# Patient Record
Sex: Female | Born: 1970 | Race: White | Hispanic: No | Marital: Single
Health system: Southern US, Community
[De-identification: ages and names within clinical notes are randomized; demographics above are authoritative.]

## PROBLEM LIST (undated history)
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
## 1998-03-01 ENCOUNTER — Ambulatory Visit (HOSPITAL_COMMUNITY): Admission: RE | Admit: 1998-03-01 | Discharge: 1998-03-01 | Payer: Self-pay

## 1998-06-19 ENCOUNTER — Other Ambulatory Visit: Admission: RE | Admit: 1998-06-19 | Discharge: 1998-06-19 | Payer: Self-pay | Admitting: Gynecology

## 1998-11-14 ENCOUNTER — Encounter: Payer: Self-pay | Admitting: Family Medicine

## 1998-11-14 ENCOUNTER — Ambulatory Visit (HOSPITAL_COMMUNITY): Admission: RE | Admit: 1998-11-14 | Discharge: 1998-11-14 | Payer: Self-pay | Admitting: Family Medicine

## 1999-12-18 ENCOUNTER — Other Ambulatory Visit: Admission: RE | Admit: 1999-12-18 | Discharge: 1999-12-18 | Payer: Self-pay | Admitting: Obstetrics and Gynecology

## 2000-06-11 ENCOUNTER — Inpatient Hospital Stay (HOSPITAL_COMMUNITY): Admission: AD | Admit: 2000-06-11 | Discharge: 2000-06-14 | Payer: Self-pay | Admitting: Obstetrics and Gynecology

## 2000-06-15 ENCOUNTER — Encounter: Admission: RE | Admit: 2000-06-15 | Discharge: 2000-07-16 | Payer: Self-pay | Admitting: Obstetrics & Gynecology

## 2012-01-22 ENCOUNTER — Telehealth: Payer: Self-pay

## 2012-01-22 NOTE — Telephone Encounter (Signed)
I tried to contact pt re: CT results. I cannot reach pt at either contact # in the chart. I will wait to hear back from her. JO CMA

## 2012-11-06 ENCOUNTER — Encounter: Payer: Self-pay | Admitting: Obstetrics and Gynecology

## 2020-07-15 ENCOUNTER — Emergency Department (HOSPITAL_COMMUNITY): Payer: Self-pay

## 2020-07-15 ENCOUNTER — Emergency Department (HOSPITAL_COMMUNITY): Payer: Self-pay | Admitting: Anesthesiology

## 2020-07-15 ENCOUNTER — Encounter (HOSPITAL_COMMUNITY): Payer: Self-pay | Admitting: General Surgery

## 2020-07-15 ENCOUNTER — Inpatient Hospital Stay (HOSPITAL_COMMUNITY): Payer: Self-pay

## 2020-07-15 ENCOUNTER — Encounter (HOSPITAL_COMMUNITY): Admission: EM | Disposition: E | Payer: Self-pay | Source: Home / Self Care

## 2020-07-15 ENCOUNTER — Inpatient Hospital Stay (HOSPITAL_COMMUNITY)
Admission: EM | Admit: 2020-07-15 | Discharge: 2020-08-07 | DRG: 957 | Disposition: E | Payer: Self-pay | Attending: Surgery | Admitting: Surgery

## 2020-07-15 DIAGNOSIS — S71111A Laceration without foreign body, right thigh, initial encounter: Secondary | ICD-10-CM | POA: Diagnosis present

## 2020-07-15 DIAGNOSIS — F1010 Alcohol abuse, uncomplicated: Secondary | ICD-10-CM | POA: Diagnosis present

## 2020-07-15 DIAGNOSIS — Z419 Encounter for procedure for purposes other than remedying health state, unspecified: Secondary | ICD-10-CM

## 2020-07-15 DIAGNOSIS — K7030 Alcoholic cirrhosis of liver without ascites: Secondary | ICD-10-CM

## 2020-07-15 DIAGNOSIS — R319 Hematuria, unspecified: Secondary | ICD-10-CM | POA: Diagnosis present

## 2020-07-15 DIAGNOSIS — R402142 Coma scale, eyes open, spontaneous, at arrival to emergency department: Secondary | ICD-10-CM | POA: Diagnosis present

## 2020-07-15 DIAGNOSIS — R0902 Hypoxemia: Secondary | ICD-10-CM

## 2020-07-15 DIAGNOSIS — R402222 Coma scale, best verbal response, incomprehensible words, at arrival to emergency department: Secondary | ICD-10-CM | POA: Diagnosis present

## 2020-07-15 DIAGNOSIS — S36521A Contusion of transverse colon, initial encounter: Secondary | ICD-10-CM | POA: Diagnosis present

## 2020-07-15 DIAGNOSIS — L8990 Pressure ulcer of unspecified site, unspecified stage: Secondary | ICD-10-CM | POA: Diagnosis not present

## 2020-07-15 DIAGNOSIS — S066X0A Traumatic subarachnoid hemorrhage without loss of consciousness, initial encounter: Principal | ICD-10-CM | POA: Diagnosis present

## 2020-07-15 DIAGNOSIS — D696 Thrombocytopenia, unspecified: Secondary | ICD-10-CM | POA: Diagnosis present

## 2020-07-15 DIAGNOSIS — J9601 Acute respiratory failure with hypoxia: Secondary | ICD-10-CM | POA: Diagnosis not present

## 2020-07-15 DIAGNOSIS — Z66 Do not resuscitate: Secondary | ICD-10-CM | POA: Diagnosis not present

## 2020-07-15 DIAGNOSIS — Z7189 Other specified counseling: Secondary | ICD-10-CM

## 2020-07-15 DIAGNOSIS — N179 Acute kidney failure, unspecified: Secondary | ICD-10-CM | POA: Diagnosis not present

## 2020-07-15 DIAGNOSIS — Z6820 Body mass index (BMI) 20.0-20.9, adult: Secondary | ICD-10-CM

## 2020-07-15 DIAGNOSIS — Z23 Encounter for immunization: Secondary | ICD-10-CM

## 2020-07-15 DIAGNOSIS — J9690 Respiratory failure, unspecified, unspecified whether with hypoxia or hypercapnia: Secondary | ICD-10-CM

## 2020-07-15 DIAGNOSIS — E872 Acidosis: Secondary | ICD-10-CM | POA: Diagnosis not present

## 2020-07-15 DIAGNOSIS — S3991XA Unspecified injury of abdomen, initial encounter: Secondary | ICD-10-CM

## 2020-07-15 DIAGNOSIS — S36429A Contusion of unspecified part of small intestine, initial encounter: Secondary | ICD-10-CM | POA: Diagnosis present

## 2020-07-15 DIAGNOSIS — R402362 Coma scale, best motor response, obeys commands, at arrival to emergency department: Secondary | ICD-10-CM | POA: Diagnosis present

## 2020-07-15 DIAGNOSIS — Z515 Encounter for palliative care: Secondary | ICD-10-CM

## 2020-07-15 DIAGNOSIS — S36892A Contusion of other intra-abdominal organs, initial encounter: Secondary | ICD-10-CM | POA: Diagnosis present

## 2020-07-15 DIAGNOSIS — Z9081 Acquired absence of spleen: Secondary | ICD-10-CM

## 2020-07-15 DIAGNOSIS — Z9911 Dependence on respirator [ventilator] status: Secondary | ICD-10-CM

## 2020-07-15 DIAGNOSIS — E46 Unspecified protein-calorie malnutrition: Secondary | ICD-10-CM | POA: Diagnosis present

## 2020-07-15 DIAGNOSIS — S36114A Minor laceration of liver, initial encounter: Secondary | ICD-10-CM | POA: Diagnosis present

## 2020-07-15 DIAGNOSIS — T148XXA Other injury of unspecified body region, initial encounter: Secondary | ICD-10-CM

## 2020-07-15 DIAGNOSIS — E870 Hyperosmolality and hypernatremia: Secondary | ICD-10-CM | POA: Diagnosis not present

## 2020-07-15 DIAGNOSIS — D62 Acute posthemorrhagic anemia: Secondary | ICD-10-CM | POA: Diagnosis not present

## 2020-07-15 DIAGNOSIS — E8809 Other disorders of plasma-protein metabolism, not elsewhere classified: Secondary | ICD-10-CM | POA: Diagnosis not present

## 2020-07-15 DIAGNOSIS — T1490XA Injury, unspecified, initial encounter: Secondary | ICD-10-CM

## 2020-07-15 DIAGNOSIS — Z4659 Encounter for fitting and adjustment of other gastrointestinal appliance and device: Secondary | ICD-10-CM

## 2020-07-15 DIAGNOSIS — Z20822 Contact with and (suspected) exposure to covid-19: Secondary | ICD-10-CM | POA: Diagnosis present

## 2020-07-15 DIAGNOSIS — J9602 Acute respiratory failure with hypercapnia: Secondary | ICD-10-CM | POA: Diagnosis not present

## 2020-07-15 DIAGNOSIS — J189 Pneumonia, unspecified organism: Secondary | ICD-10-CM | POA: Diagnosis not present

## 2020-07-15 DIAGNOSIS — S0081XA Abrasion of other part of head, initial encounter: Secondary | ICD-10-CM | POA: Diagnosis present

## 2020-07-15 DIAGNOSIS — S36039A Unspecified laceration of spleen, initial encounter: Secondary | ICD-10-CM | POA: Diagnosis present

## 2020-07-15 DIAGNOSIS — S36113A Laceration of liver, unspecified degree, initial encounter: Secondary | ICD-10-CM | POA: Diagnosis present

## 2020-07-15 DIAGNOSIS — R339 Retention of urine, unspecified: Secondary | ICD-10-CM | POA: Diagnosis not present

## 2020-07-15 DIAGNOSIS — S82841B Displaced bimalleolar fracture of right lower leg, initial encounter for open fracture type I or II: Secondary | ICD-10-CM | POA: Diagnosis present

## 2020-07-15 DIAGNOSIS — Z978 Presence of other specified devices: Secondary | ICD-10-CM

## 2020-07-15 DIAGNOSIS — K7031 Alcoholic cirrhosis of liver with ascites: Secondary | ICD-10-CM | POA: Diagnosis present

## 2020-07-15 DIAGNOSIS — I959 Hypotension, unspecified: Secondary | ICD-10-CM | POA: Diagnosis present

## 2020-07-15 DIAGNOSIS — D689 Coagulation defect, unspecified: Secondary | ICD-10-CM | POA: Diagnosis present

## 2020-07-15 HISTORY — PX: LAPAROTOMY: SHX154

## 2020-07-15 LAB — URINALYSIS, ROUTINE W REFLEX MICROSCOPIC
Bilirubin Urine: NEGATIVE
Glucose, UA: >=500 mg/dL — AB
Ketones, ur: NEGATIVE mg/dL
Nitrite: NEGATIVE
Protein, ur: NEGATIVE mg/dL
Specific Gravity, Urine: 1.006 (ref 1.005–1.030)
pH: 6 (ref 5.0–8.0)

## 2020-07-15 LAB — I-STAT ARTERIAL BLOOD GAS, ED
Acid-base deficit: 11 mmol/L — ABNORMAL HIGH (ref 0.0–2.0)
Acid-base deficit: 13 mmol/L — ABNORMAL HIGH (ref 0.0–2.0)
Bicarbonate: 16 mmol/L — ABNORMAL LOW (ref 20.0–28.0)
Bicarbonate: 12.9 mmol/L — ABNORMAL LOW (ref 20.0–28.0)
Calcium, Ion: 0.86 mmol/L — CL (ref 1.15–1.40)
Calcium, Ion: 1.15 mmol/L (ref 1.15–1.40)
HCT: 33 % — ABNORMAL LOW (ref 36.0–46.0)
HCT: 32 % — ABNORMAL LOW (ref 36.0–46.0)
Hemoglobin: 11.2 g/dL — ABNORMAL LOW (ref 12.0–15.0)
Hemoglobin: 10.9 g/dL — ABNORMAL LOW (ref 12.0–15.0)
O2 Saturation: 100 %
O2 Saturation: 99 %
Potassium: 4.3 mmol/L (ref 3.5–5.1)
Potassium: 3.8 mmol/L (ref 3.5–5.1)
Sodium: 143 mmol/L (ref 135–145)
Sodium: 143 mmol/L (ref 135–145)
TCO2: 17 mmol/L — ABNORMAL LOW (ref 22–32)
TCO2: 14 mmol/L — ABNORMAL LOW (ref 22–32)
pCO2 arterial: 39.3 mmHg (ref 32.0–48.0)
pCO2 arterial: 29.2 mmHg — ABNORMAL LOW (ref 32.0–48.0)
pH, Arterial: 7.218 — ABNORMAL LOW (ref 7.350–7.450)
pH, Arterial: 7.254 — ABNORMAL LOW (ref 7.350–7.450)
pO2, Arterial: 376 mmHg — ABNORMAL HIGH (ref 83.0–108.0)
pO2, Arterial: 157 mmHg — ABNORMAL HIGH (ref 83.0–108.0)

## 2020-07-15 LAB — PROTIME-INR
INR: 1.7 — ABNORMAL HIGH (ref 0.8–1.2)
INR: 1.3 — ABNORMAL HIGH (ref 0.8–1.2)
Prothrombin Time: 19 seconds — ABNORMAL HIGH (ref 11.4–15.2)
Prothrombin Time: 15.7 seconds — ABNORMAL HIGH (ref 11.4–15.2)

## 2020-07-15 LAB — I-STAT CHEM 8, ED
BUN: <3 mg/dL — ABNORMAL LOW (ref 6–20)
Calcium, Ion: 0.89 mmol/L — CL (ref 1.15–1.40)
Chloride: 113 mmol/L — ABNORMAL HIGH (ref 98–111)
Creatinine, Ser: 0.9 mg/dL (ref 0.44–1.00)
Glucose, Bld: 146 mg/dL — ABNORMAL HIGH (ref 70–99)
HCT: 29 % — ABNORMAL LOW (ref 36.0–46.0)
Hemoglobin: 9.9 g/dL — ABNORMAL LOW (ref 12.0–15.0)
Potassium: 2.6 mmol/L — CL (ref 3.5–5.1)
Sodium: 146 mmol/L — ABNORMAL HIGH (ref 135–145)
TCO2: 17 mmol/L — ABNORMAL LOW (ref 22–32)

## 2020-07-15 LAB — BLOOD GAS, ARTERIAL
Acid-base deficit: 5.3 mmol/L — ABNORMAL HIGH (ref 0.0–2.0)
Bicarbonate: 19.6 mmol/L — ABNORMAL LOW (ref 20.0–28.0)
Drawn by: 252031
FIO2: 100
O2 Saturation: 99.5 %
Patient temperature: 35.1
pCO2 arterial: 35.6 mmHg (ref 32.0–48.0)
pH, Arterial: 7.35 (ref 7.350–7.450)
pO2, Arterial: 377 mmHg — ABNORMAL HIGH (ref 83.0–108.0)

## 2020-07-15 LAB — BASIC METABOLIC PANEL
Anion gap: 18 — ABNORMAL HIGH (ref 5–15)
BUN: 5 mg/dL — ABNORMAL LOW (ref 6–20)
CO2: 19 mmol/L — ABNORMAL LOW (ref 22–32)
Calcium: 8.2 mg/dL — ABNORMAL LOW (ref 8.9–10.3)
Chloride: 108 mmol/L (ref 98–111)
Creatinine, Ser: 0.63 mg/dL (ref 0.44–1.00)
GFR, Estimated: >60 mL/min (ref >60)
Glucose, Bld: 169 mg/dL — ABNORMAL HIGH (ref 70–99)
Potassium: 3.2 mmol/L — ABNORMAL LOW (ref 3.5–5.1)
Sodium: 145 mmol/L (ref 135–145)

## 2020-07-15 LAB — CBC
HCT: 27.7 % — ABNORMAL LOW (ref 36.0–46.0)
Hemoglobin: 9.4 g/dL — ABNORMAL LOW (ref 12.0–15.0)
MCH: 30.5 pg (ref 26.0–34.0)
MCHC: 33.9 g/dL (ref 30.0–36.0)
MCV: 89.9 fL (ref 80.0–100.0)
Platelets: 55 10*3/uL — ABNORMAL LOW (ref 150–400)
RBC: 3.08 MIL/uL — ABNORMAL LOW (ref 3.87–5.11)
RDW: 15.9 % — ABNORMAL HIGH (ref 11.5–15.5)
WBC: 8.9 10*3/uL (ref 4.0–10.5)
nRBC: 0.2 % (ref 0.0–0.2)

## 2020-07-15 LAB — RESPIRATORY PANEL BY RT PCR (FLU A&B, COVID)
Influenza A by PCR: NEGATIVE
Influenza B by PCR: NEGATIVE
SARS Coronavirus 2 by RT PCR: NEGATIVE

## 2020-07-15 LAB — ABO/RH: ABO/RH(D): O POS

## 2020-07-15 LAB — ETHANOL: Alcohol, Ethyl (B): 321 mg/dL (ref <10)

## 2020-07-15 LAB — HIV ANTIBODY (ROUTINE TESTING W REFLEX): HIV Screen 4th Generation wRfx: NONREACTIVE

## 2020-07-15 LAB — PREPARE RBC (CROSSMATCH)

## 2020-07-15 LAB — I-STAT BETA HCG BLOOD, ED (MC, WL, AP ONLY): I-stat hCG, quantitative: <5 m[IU]/mL (ref <5)

## 2020-07-15 SURGERY — LAPAROTOMY, EXPLORATORY
Anesthesia: General | Site: Abdomen

## 2020-07-15 MED ORDER — CHLORHEXIDINE GLUCONATE CLOTH 2 % EX PADS
6.0000 | MEDICATED_PAD | Freq: Every day | CUTANEOUS | Status: DC
  Administered 2020-07-16: 6 via TOPICAL

## 2020-07-15 MED ORDER — ORAL CARE MOUTH RINSE
15.0000 mL | OROMUCOSAL | Status: DC
  Administered 2020-07-16 – 2020-08-02 (×177): 15 mL via OROMUCOSAL

## 2020-07-15 MED ORDER — FENTANYL CITRATE (PF) 100 MCG/2ML IJ SOLN
INTRAMUSCULAR | Status: DC | PRN
  Administered 2020-07-15 (×3): 50 ug via INTRAVENOUS

## 2020-07-15 MED ORDER — POLYETHYLENE GLYCOL 3350 17 G PO PACK: 17.0000 g | PACK | Freq: Every day | ORAL | Status: DC

## 2020-07-15 MED ORDER — VASOPRESSIN 20 UNIT/ML IV SOLN
INTRAVENOUS | Status: DC | PRN
  Administered 2020-07-15 (×3): 2 [IU] via INTRAVENOUS
  Administered 2020-07-15: 3 [IU] via INTRAVENOUS

## 2020-07-15 MED ORDER — PROPOFOL 1000 MG/100ML IV EMUL
0.0000 ug/kg/min | INTRAVENOUS | Status: DC
  Administered 2020-07-15: 50 ug/kg/min via INTRAVENOUS
  Administered 2020-07-16: 40 ug/kg/min via INTRAVENOUS
  Administered 2020-07-16 (×2): 50 ug/kg/min via INTRAVENOUS
  Administered 2020-07-16: 45 ug/kg/min via INTRAVENOUS
  Administered 2020-07-17: 50 ug/kg/min via INTRAVENOUS
  Filled 2020-07-15 (×7): qty 100

## 2020-07-15 MED ORDER — PANTOPRAZOLE SODIUM 40 MG IV SOLR
40.0000 mg | Freq: Every day | INTRAVENOUS | Status: DC
  Administered 2020-07-15 – 2020-07-30 (×16): 40 mg via INTRAVENOUS
  Filled 2020-07-15 (×16): qty 40

## 2020-07-15 MED ORDER — FENTANYL BOLUS VIA INFUSION
50.0000 ug | INTRAVENOUS | Status: DC | PRN
  Filled 2020-07-15: qty 50

## 2020-07-15 MED ORDER — SODIUM CHLORIDE 0.9 % IV SOLN: INTRAVENOUS | Status: DC | PRN

## 2020-07-15 MED ORDER — PROPOFOL 500 MG/50ML IV EMUL
INTRAVENOUS | Status: DC | PRN
  Administered 2020-07-15: 50 ug/kg/min via INTRAVENOUS

## 2020-07-15 MED ORDER — CALCIUM CHLORIDE 10 % IV SOLN
INTRAVENOUS | Status: DC | PRN
  Administered 2020-07-15: .6 g via INTRAVENOUS
  Administered 2020-07-15 (×2): .2 g via INTRAVENOUS

## 2020-07-15 MED ORDER — SODIUM CHLORIDE 0.9% IV SOLUTION: Freq: Once | INTRAVENOUS | Status: DC

## 2020-07-15 MED ORDER — DEXMEDETOMIDINE HCL IN NACL 400 MCG/100ML IV SOLN
0.0000 ug/kg/h | INTRAVENOUS | Status: AC
  Administered 2020-07-15 – 2020-07-17 (×3): 0.4 ug/kg/h via INTRAVENOUS
  Administered 2020-07-17: 0.6 ug/kg/h via INTRAVENOUS
  Administered 2020-07-18: 0.4 ug/kg/h via INTRAVENOUS
  Filled 2020-07-15 (×6): qty 100

## 2020-07-15 MED ORDER — ROCURONIUM BROMIDE 10 MG/ML (PF) SYRINGE
PREFILLED_SYRINGE | INTRAVENOUS | Status: DC | PRN
  Administered 2020-07-15: 70 mg via INTRAVENOUS

## 2020-07-15 MED ORDER — POTASSIUM CHLORIDE IN NACL 20-0.9 MEQ/L-% IV SOLN
INTRAVENOUS | Status: DC
  Filled 2020-07-15 (×4): qty 1000

## 2020-07-15 MED ORDER — DEXAMETHASONE SODIUM PHOSPHATE 10 MG/ML IJ SOLN
INTRAMUSCULAR | Status: DC | PRN
  Administered 2020-07-15: 5 mg via INTRAVENOUS

## 2020-07-15 MED ORDER — SUCCINYLCHOLINE CHLORIDE 20 MG/ML IJ SOLN
INTRAMUSCULAR | Status: DC | PRN
  Administered 2020-07-15: 120 mg via INTRAVENOUS

## 2020-07-15 MED ORDER — PHENYLEPHRINE HCL (PRESSORS) 10 MG/ML IV SOLN
INTRAVENOUS | Status: DC | PRN
  Administered 2020-07-15 (×2): 200 ug via INTRAVENOUS

## 2020-07-15 MED ORDER — MIDAZOLAM HCL 5 MG/5ML IJ SOLN
INTRAMUSCULAR | Status: DC | PRN
  Administered 2020-07-15: 2 mg via INTRAVENOUS

## 2020-07-15 MED ORDER — VASOPRESSIN 20 UNIT/ML IV SOLN
INTRAVENOUS | Status: AC
  Filled 2020-07-15: qty 1

## 2020-07-15 MED ORDER — FENTANYL CITRATE (PF) 250 MCG/5ML IJ SOLN
INTRAMUSCULAR | Status: AC
  Filled 2020-07-15: qty 5

## 2020-07-15 MED ORDER — ALBUMIN HUMAN 5 % IV SOLN: INTRAVENOUS | Status: DC | PRN

## 2020-07-15 MED ORDER — PHENYLEPHRINE HCL-NACL 10-0.9 MG/250ML-% IV SOLN
INTRAVENOUS | Status: DC | PRN
  Administered 2020-07-15: 100 ug/min via INTRAVENOUS

## 2020-07-15 MED ORDER — PANTOPRAZOLE SODIUM 40 MG PO TBEC
40.0000 mg | DELAYED_RELEASE_TABLET | Freq: Every day | ORAL | Status: DC
  Administered 2020-07-31: 40 mg via ORAL
  Filled 2020-07-15 (×2): qty 1

## 2020-07-15 MED ORDER — CEFAZOLIN SODIUM-DEXTROSE 2-3 GM-%(50ML) IV SOLR
INTRAVENOUS | Status: DC | PRN
  Administered 2020-07-15: 2 g via INTRAVENOUS

## 2020-07-15 MED ORDER — PROPOFOL 10 MG/ML IV BOLUS
INTRAVENOUS | Status: DC | PRN
  Administered 2020-07-15: 60 mg via INTRAVENOUS

## 2020-07-15 MED ORDER — 0.9 % SODIUM CHLORIDE (POUR BTL) OPTIME
TOPICAL | Status: DC | PRN
  Administered 2020-07-15: 2000 mL

## 2020-07-15 MED ORDER — ONDANSETRON HCL 4 MG/2ML IJ SOLN
4.0000 mg | Freq: Four times a day (QID) | INTRAMUSCULAR | Status: DC | PRN
  Administered 2020-07-21 – 2020-07-25 (×4): 4 mg via INTRAVENOUS
  Filled 2020-07-15 (×4): qty 2

## 2020-07-15 MED ORDER — ONDANSETRON 4 MG PO TBDP: 4.0000 mg | ORAL_TABLET | Freq: Four times a day (QID) | ORAL | Status: DC | PRN

## 2020-07-15 MED ORDER — FENTANYL 2500MCG IN NS 250ML (10MCG/ML) PREMIX INFUSION
50.0000 ug/h | INTRAVENOUS | Status: DC
  Administered 2020-07-15: 50 ug/h via INTRAVENOUS
  Administered 2020-07-16: 125 ug/h via INTRAVENOUS
  Administered 2020-07-17 – 2020-07-18 (×2): 100 ug/h via INTRAVENOUS
  Administered 2020-07-20: 75 ug/h via INTRAVENOUS
  Administered 2020-07-21: 100 ug/h via INTRAVENOUS
  Administered 2020-07-22: 75 ug/h via INTRAVENOUS
  Administered 2020-07-23: 100 ug/h via INTRAVENOUS
  Filled 2020-07-15 (×8): qty 250

## 2020-07-15 MED ORDER — METOPROLOL TARTRATE 5 MG/5ML IV SOLN
5.0000 mg | Freq: Four times a day (QID) | INTRAVENOUS | Status: DC | PRN
  Filled 2020-07-15: qty 5

## 2020-07-15 MED ORDER — PROPOFOL 1000 MG/100ML IV EMUL: 5.0000 ug/kg/min | INTRAVENOUS | Status: DC

## 2020-07-15 MED ORDER — FENTANYL CITRATE (PF) 100 MCG/2ML IJ SOLN
50.0000 ug | Freq: Once | INTRAMUSCULAR | Status: AC
  Administered 2020-07-15: 50 ug via INTRAVENOUS
  Filled 2020-07-15: qty 2

## 2020-07-15 MED ORDER — SODIUM BICARBONATE 8.4 % IV SOLN
INTRAVENOUS | Status: DC | PRN
  Administered 2020-07-15: 50 meq via INTRAVENOUS

## 2020-07-15 MED ORDER — DOCUSATE SODIUM 50 MG/5ML PO LIQD: 100.0000 mg | Freq: Two times a day (BID) | ORAL | Status: DC

## 2020-07-15 MED ORDER — CHLORHEXIDINE GLUCONATE 0.12% ORAL RINSE (MEDLINE KIT)
15.0000 mL | Freq: Two times a day (BID) | OROMUCOSAL | Status: DC
  Administered 2020-07-15 – 2020-08-02 (×35): 15 mL via OROMUCOSAL

## 2020-07-15 SURGICAL SUPPLY — 47 items
APPLIER CLIP ROT 10 11.4 M/L (STAPLE) ×2
BENZOIN TINCTURE PRP APPL 2/3 (GAUZE/BANDAGES/DRESSINGS) ×4 IMPLANT
BLADE CLIPPER SURG (BLADE) IMPLANT
CANISTER SUCT 3000ML PPV (MISCELLANEOUS) IMPLANT
CHLORAPREP W/TINT 26 (MISCELLANEOUS) ×2 IMPLANT
CLIP APPLIE ROT 10 11.4 M/L (STAPLE) ×1 IMPLANT
COVER SURGICAL LIGHT HANDLE (MISCELLANEOUS) ×2 IMPLANT
DRAIN CHANNEL 19F RND (DRAIN) ×2 IMPLANT
DRAPE LAPAROSCOPIC ABDOMINAL (DRAPES) ×2 IMPLANT
DRAPE WARM FLUID 44X44 (DRAPES) ×2 IMPLANT
DRSG OPSITE POSTOP 4X10 (GAUZE/BANDAGES/DRESSINGS) IMPLANT
DRSG OPSITE POSTOP 4X8 (GAUZE/BANDAGES/DRESSINGS) IMPLANT
ELECT BLADE 6.5 EXT (BLADE) IMPLANT
ELECT CAUTERY BLADE 6.4 (BLADE) ×2 IMPLANT
ELECT REM PT RETURN 9FT ADLT (ELECTROSURGICAL) ×2
ELECTRODE REM PT RTRN 9FT ADLT (ELECTROSURGICAL) ×1 IMPLANT
EVACUATOR SILICONE 100CC (DRAIN) ×2 IMPLANT
GLOVE BIO SURGEON STRL SZ8 (GLOVE) ×8 IMPLANT
GLOVE BIOGEL PI IND STRL 8 (GLOVE) ×3 IMPLANT
GLOVE BIOGEL PI INDICATOR 8 (GLOVE) ×3
GOWN STRL REUS W/ TWL LRG LVL3 (GOWN DISPOSABLE) ×1 IMPLANT
GOWN STRL REUS W/ TWL XL LVL3 (GOWN DISPOSABLE) ×3 IMPLANT
GOWN STRL REUS W/TWL LRG LVL3 (GOWN DISPOSABLE) ×2
GOWN STRL REUS W/TWL XL LVL3 (GOWN DISPOSABLE) ×6
HANDLE SUCTION POOLE (INSTRUMENTS) ×1 IMPLANT
HEMOSTAT SNOW SURGICEL 2X4 (HEMOSTASIS) ×6 IMPLANT
KIT BASIN OR (CUSTOM PROCEDURE TRAY) ×2 IMPLANT
KIT TURNOVER KIT B (KITS) ×2 IMPLANT
LIGASURE IMPACT 36 18CM CVD LR (INSTRUMENTS) ×2 IMPLANT
NS IRRIG 1000ML POUR BTL (IV SOLUTION) ×4 IMPLANT
PACK GENERAL/GYN (CUSTOM PROCEDURE TRAY) ×2 IMPLANT
PAD ARMBOARD 7.5X6 YLW CONV (MISCELLANEOUS) ×2 IMPLANT
PENCIL SMOKE EVACUATOR (MISCELLANEOUS) IMPLANT
SPONGE ABD ABTHERA ADVANCE (MISCELLANEOUS) ×2 IMPLANT
SPONGE LAP 18X18 RF (DISPOSABLE) ×10 IMPLANT
STAPLER VISISTAT 35W (STAPLE) ×2 IMPLANT
SUCTION POOLE HANDLE (INSTRUMENTS) ×2
SUT PDS AB 1 TP1 96 (SUTURE) IMPLANT
SUT SILK 2 0 SH CR/8 (SUTURE) ×2 IMPLANT
SUT SILK 2 0 TIES 10X30 (SUTURE) ×2 IMPLANT
SUT SILK 3 0 SH CR/8 (SUTURE) ×2 IMPLANT
SUT SILK 3 0 TIES 10X30 (SUTURE) ×2 IMPLANT
SUT VIC AB 3-0 SH 27 (SUTURE) ×4
SUT VIC AB 3-0 SH 27X BRD (SUTURE) ×2 IMPLANT
TOWEL GREEN STERILE (TOWEL DISPOSABLE) ×2 IMPLANT
TRAY FOLEY MTR SLVR 16FR STAT (SET/KITS/TRAYS/PACK) ×2 IMPLANT
YANKAUER SUCT BULB TIP NO VENT (SUCTIONS) IMPLANT
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
## 2020-07-15 NOTE — Transfer of Care (Signed)
Immediate Anesthesia Transfer of Care Note  Patient: Diana Foster  Procedure(s) Performed: EXPLORATORY LAPAROTOMY OPEN  SPLENECTOMY; OPEN CHOLECYSTECTOMY (N/A Abdomen)  Patient Location: PACU  Anesthesia Type:General  Level of Consciousness: sedated and Patient remains intubated per anesthesia plan  Airway & Oxygen Therapy: Patient remains intubated per anesthesia plan and Patient placed on Ventilator (see vital sign flow sheet for setting)  Post-op Assessment: Report given to RN and Post -op Vital signs reviewed and stable  Post vital signs: Reviewed and stable  Last Vitals:  Vitals Value Taken Time  BP 130/96 07/25/2020 1942  Temp 35.3 C 08/05/2020 1942  Pulse 93 08/03/2020 1942  Resp    SpO2 100 % 07/08/2020 1942    Last Pain:  Vitals:   07/07/2020 1702  TempSrc: Temporal         Complications: No complications documented.
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
## 2020-07-15 NOTE — TOC Progression Note (Addendum)
Transition of Care [facility]) - Progression Note    Patient Details  Name: SHANASIA IBRAHIM MRN: [id number] Date of Birth: 1971-08-04  Transition of Care [facility]) CM/SW Contact  Lockie Pares, RN Phone Number: 07/07/2020, 5:47 PM  Clinical Narrative:    Spoke with April, She is coming to Hospital, Emergency contact updated. OR alerted that April will be at hospital waiting for word on Ms Siess from the [facility]. Terald Sleeper and April state that MS Cecere "Drinks"         Expected Discharge Plan and Services                                                 Social Determinants of Health (SDOH) Interventions    Readmission Risk Interventions No flowsheet data found.
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
## 2020-07-15 NOTE — Anesthesia Postprocedure Evaluation (Signed)
Anesthesia Post Note  Patient: Diana Foster  Procedure(s) Performed: EXPLORATORY LAPAROTOMY OPEN  SPLENECTOMY; OPEN CHOLECYSTECTOMY (N/A Abdomen)     Patient location during evaluation: PACU (Awaiting ICU bed) Anesthesia Type: General Level of consciousness: sedated and patient remains intubated per anesthesia plan Pain management: pain level controlled Vital Signs Assessment: post-procedure vital signs reviewed and stable Respiratory status: patient remains intubated per anesthesia plan Cardiovascular status: stable Postop Assessment: no apparent nausea or vomiting Anesthetic complications: no   No complications documented.  Last Vitals:  Vitals:   07/09/2020 2045 08/01/2020 2056  BP: (!) 131/105 (!) 141/103  Pulse: 98 99  Resp: 12   Temp:    SpO2: 100% 100%    Last Pain:  Vitals:   07/13/2020 2001  TempSrc: Temporal                 Beryle Lathe
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
## 2020-07-15 NOTE — Anesthesia Procedure Notes (Signed)
Procedure Name: Intubation Date/Time: 07/27/2020 5:35 PM Performed by: Edmonia Caprio, CRNA Pre-anesthesia Checklist: Patient identified, Emergency Drugs available, Suction available, Patient being monitored and Timeout performed Patient Re-evaluated:Patient Re-evaluated prior to induction Oxygen Delivery Method: Circle system utilized Preoxygenation: Pre-oxygenation with 100% oxygen Induction Type: IV induction and Rapid sequence Laryngoscope Size: Glidescope and 3 Grade View: Grade I Tube type: Oral Tube size: 7.0 mm Number of attempts: 1 Airway Equipment and Method: Stylet and Video-laryngoscopy Placement Confirmation: ETT inserted through vocal cords under direct vision,  positive ETCO2 and breath sounds checked- equal and bilateral Secured at: 22 cm Tube secured with: Tape Dental Injury: Teeth and Oropharynx as per pre-operative assessment  Comments: COVID test not resulted yet. All precautions taken. Manual in-line stabilization by Dr. Janee Morn during intubation.
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
## 2020-07-16 ENCOUNTER — Encounter (HOSPITAL_COMMUNITY): Payer: Self-pay | Admitting: General Surgery

## 2020-07-16 ENCOUNTER — Inpatient Hospital Stay: Payer: Self-pay

## 2020-07-16 ENCOUNTER — Inpatient Hospital Stay (HOSPITAL_COMMUNITY): Payer: Self-pay

## 2020-07-16 DIAGNOSIS — J9601 Acute respiratory failure with hypoxia: Secondary | ICD-10-CM

## 2020-07-16 DIAGNOSIS — D689 Coagulation defect, unspecified: Secondary | ICD-10-CM

## 2020-07-16 DIAGNOSIS — K7030 Alcoholic cirrhosis of liver without ascites: Secondary | ICD-10-CM

## 2020-07-16 DIAGNOSIS — J9602 Acute respiratory failure with hypercapnia: Secondary | ICD-10-CM

## 2020-07-16 DIAGNOSIS — Z515 Encounter for palliative care: Secondary | ICD-10-CM

## 2020-07-16 LAB — MRSA PCR SCREENING: MRSA by PCR: NEGATIVE

## 2020-07-16 LAB — BPAM PLATELET PHERESIS
Blood Product Expiration Date: 202110102359
ISSUE DATE / TIME: 202110092004
Unit Type and Rh: 5100

## 2020-07-16 LAB — CBC
HCT: 27.1 % — ABNORMAL LOW (ref 36.0–46.0)
HCT: 25.7 % — ABNORMAL LOW (ref 36.0–46.0)
Hemoglobin: 9.4 g/dL — ABNORMAL LOW (ref 12.0–15.0)
Hemoglobin: 8.6 g/dL — ABNORMAL LOW (ref 12.0–15.0)
MCH: 31.1 pg (ref 26.0–34.0)
MCH: 30.6 pg (ref 26.0–34.0)
MCHC: 34.7 g/dL (ref 30.0–36.0)
MCHC: 33.5 g/dL (ref 30.0–36.0)
MCV: 89.7 fL (ref 80.0–100.0)
MCV: 91.5 fL (ref 80.0–100.0)
Platelets: 110 10*3/uL — ABNORMAL LOW (ref 150–400)
Platelets: 99 10*3/uL — ABNORMAL LOW (ref 150–400)
RBC: 3.02 MIL/uL — ABNORMAL LOW (ref 3.87–5.11)
RBC: 2.81 MIL/uL — ABNORMAL LOW (ref 3.87–5.11)
RDW: 18.6 % — ABNORMAL HIGH (ref 11.5–15.5)
RDW: 19.2 % — ABNORMAL HIGH (ref 11.5–15.5)
WBC: 8.1 10*3/uL (ref 4.0–10.5)
WBC: 9.9 10*3/uL (ref 4.0–10.5)
nRBC: 0 % (ref 0.0–0.2)
nRBC: 0.4 % — ABNORMAL HIGH (ref 0.0–0.2)

## 2020-07-16 LAB — POCT I-STAT 7, (LYTES, BLD GAS, ICA,H+H)
Acid-Base Excess: 0 mmol/L (ref 0.0–2.0)
Bicarbonate: 25.1 mmol/L (ref 20.0–28.0)
Calcium, Ion: 1.03 mmol/L — ABNORMAL LOW (ref 1.15–1.40)
HCT: 24 % — ABNORMAL LOW (ref 36.0–46.0)
Hemoglobin: 8.2 g/dL — ABNORMAL LOW (ref 12.0–15.0)
O2 Saturation: 100 %
Patient temperature: 98.6
Potassium: 3.5 mmol/L (ref 3.5–5.1)
Sodium: 145 mmol/L (ref 135–145)
TCO2: 26 mmol/L (ref 22–32)
pCO2 arterial: 42.6 mmHg (ref 32.0–48.0)
pH, Arterial: 7.379 (ref 7.350–7.450)
pO2, Arterial: 494 mmHg — ABNORMAL HIGH (ref 83.0–108.0)

## 2020-07-16 LAB — TRAUMA TEG PANEL
CFF Max Amplitude: 22.6 mm (ref 15–32)
Citrated Kaolin (R): 5.6 min (ref 4.6–9.1)
Citrated Rapid TEG (MA): 60.7 mm (ref 52–70)
Lysis at 30 Minutes: 0 % (ref 0.0–2.6)

## 2020-07-16 LAB — BASIC METABOLIC PANEL
Anion gap: 17 — ABNORMAL HIGH (ref 5–15)
BUN: 6 mg/dL (ref 6–20)
CO2: 19 mmol/L — ABNORMAL LOW (ref 22–32)
Calcium: 7.8 mg/dL — ABNORMAL LOW (ref 8.9–10.3)
Chloride: 110 mmol/L (ref 98–111)
Creatinine, Ser: 0.61 mg/dL (ref 0.44–1.00)
GFR, Estimated: >60 mL/min (ref >60)
Glucose, Bld: 157 mg/dL — ABNORMAL HIGH (ref 70–99)
Potassium: 3.6 mmol/L (ref 3.5–5.1)
Sodium: 146 mmol/L — ABNORMAL HIGH (ref 135–145)

## 2020-07-16 LAB — PREPARE PLATELET PHERESIS: Unit division: 0

## 2020-07-16 LAB — PROTIME-INR
INR: 1.3 — ABNORMAL HIGH (ref 0.8–1.2)
Prothrombin Time: 15.5 seconds — ABNORMAL HIGH (ref 11.4–15.2)

## 2020-07-16 LAB — TRIGLYCERIDES: Triglycerides: 188 mg/dL — ABNORMAL HIGH (ref <150)

## 2020-07-16 MED ORDER — ADULT MULTIVITAMIN W/MINERALS CH
1.0000 | ORAL_TABLET | Freq: Every day | ORAL | Status: DC
  Administered 2020-07-17 – 2020-08-01 (×16): 1
  Filled 2020-07-16 (×16): qty 1

## 2020-07-16 MED ORDER — SODIUM CHLORIDE 0.9 % IV SOLN: INTRAVENOUS | Status: DC | PRN

## 2020-07-16 MED ORDER — LEVETIRACETAM IN NACL 500 MG/100ML IV SOLN
500.0000 mg | Freq: Two times a day (BID) | INTRAVENOUS | Status: AC
  Administered 2020-07-16 – 2020-07-23 (×14): 500 mg via INTRAVENOUS
  Filled 2020-07-16 (×14): qty 100

## 2020-07-16 MED ORDER — DOCUSATE SODIUM 50 MG/5ML PO LIQD
100.0000 mg | Freq: Two times a day (BID) | ORAL | Status: DC
  Administered 2020-07-16 – 2020-07-31 (×25): 100 mg
  Filled 2020-07-16 (×24): qty 10

## 2020-07-16 MED ORDER — OXYCODONE HCL 5 MG/5ML PO SOLN
5.0000 mg | ORAL | Status: DC | PRN
  Administered 2020-07-18: 5 mg
  Administered 2020-07-19 – 2020-07-28 (×8): 10 mg
  Administered 2020-07-28: 5 mg
  Administered 2020-07-30: 10 mg
  Filled 2020-07-16: qty 10
  Filled 2020-07-16: qty 5
  Filled 2020-07-16 (×4): qty 10
  Filled 2020-07-16: qty 5
  Filled 2020-07-16 (×4): qty 10

## 2020-07-16 MED ORDER — THIAMINE HCL 100 MG/ML IJ SOLN
100.0000 mg | Freq: Every day | INTRAMUSCULAR | Status: DC
  Administered 2020-07-17 – 2020-07-30 (×13): 100 mg via INTRAVENOUS
  Filled 2020-07-16 (×13): qty 2

## 2020-07-16 MED ORDER — ALBUMIN HUMAN 5 % IV SOLN
25.0000 g | Freq: Once | INTRAVENOUS | Status: AC
  Administered 2020-07-16: 25 g via INTRAVENOUS
  Filled 2020-07-16: qty 500

## 2020-07-16 MED ORDER — LORAZEPAM 2 MG/ML IJ SOLN: 1.0000 mg | INTRAMUSCULAR | Status: AC | PRN

## 2020-07-16 MED ORDER — POLYETHYLENE GLYCOL 3350 17 G PO PACK
17.0000 g | PACK | Freq: Every day | ORAL | Status: DC
  Administered 2020-07-17 – 2020-07-27 (×8): 17 g
  Filled 2020-07-16 (×8): qty 1

## 2020-07-16 MED ORDER — THIAMINE HCL 100 MG PO TABS
100.0000 mg | ORAL_TABLET | Freq: Every day | ORAL | Status: DC
  Administered 2020-07-16 – 2020-07-31 (×3): 100 mg via ORAL
  Filled 2020-07-16 (×5): qty 1

## 2020-07-16 MED ORDER — FOLIC ACID 1 MG PO TABS
1.0000 mg | ORAL_TABLET | Freq: Every day | ORAL | Status: DC
  Administered 2020-07-16 – 2020-08-01 (×17): 1 mg
  Filled 2020-07-16 (×17): qty 1

## 2020-07-16 MED ORDER — LORAZEPAM 1 MG PO TABS: 1.0000 mg | ORAL_TABLET | ORAL | Status: AC | PRN

## 2020-07-16 MED ORDER — ACETAMINOPHEN 500 MG PO TABS
1000.0000 mg | ORAL_TABLET | Freq: Four times a day (QID) | ORAL | Status: DC
  Administered 2020-07-16 – 2020-07-23 (×17): 1000 mg
  Filled 2020-07-16 (×17): qty 2

## 2020-07-16 MED ORDER — LACTATED RINGERS IV BOLUS
500.0000 mL | Freq: Once | INTRAVENOUS | Status: AC
  Administered 2020-07-16: 500 mL via INTRAVENOUS

## 2020-07-16 MED ORDER — POTASSIUM CHLORIDE 20 MEQ/15ML (10%) PO SOLN
40.0000 meq | Freq: Once | ORAL | Status: AC
  Administered 2020-07-16: 40 meq via ORAL
  Filled 2020-07-16: qty 30

## 2020-07-16 MED ORDER — IOHEXOL 300 MG/ML  SOLN
100.0000 mL | Freq: Once | INTRAMUSCULAR | Status: AC | PRN
  Administered 2020-07-16: 100 mL via INTRAVENOUS

## 2020-07-16 MED ORDER — SODIUM CHLORIDE 0.9% IV SOLUTION: Freq: Once | INTRAVENOUS | Status: AC

## 2020-07-16 MED ORDER — FOLIC ACID 1 MG PO TABS: 1.0000 mg | ORAL_TABLET | Freq: Every day | ORAL | Status: DC

## 2020-07-16 MED ORDER — METHOCARBAMOL 500 MG PO TABS
1000.0000 mg | ORAL_TABLET | Freq: Three times a day (TID) | ORAL | Status: DC
  Administered 2020-07-16 – 2020-07-23 (×20): 1000 mg
  Filled 2020-07-16 (×20): qty 2

## 2020-07-16 MED ORDER — ADULT MULTIVITAMIN W/MINERALS CH: 1.0000 | ORAL_TABLET | Freq: Every day | ORAL | Status: DC

## 2020-07-16 MED ORDER — NOREPINEPHRINE 4 MG/250ML-% IV SOLN
0.0000 ug/min | INTRAVENOUS | Status: DC
  Administered 2020-07-16: 2 ug/min via INTRAVENOUS
  Administered 2020-07-17: 4 ug/min via INTRAVENOUS
  Administered 2020-07-17: 8 ug/min via INTRAVENOUS
  Administered 2020-07-18: 4 ug/min via INTRAVENOUS
  Administered 2020-07-19: 6 ug/min via INTRAVENOUS
  Administered 2020-07-20: 5 ug/min via INTRAVENOUS
  Filled 2020-07-16 (×7): qty 250
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
## 2020-07-16 NOTE — Progress Notes (Signed)
All belongings listed in previous note sent home with sister including steak knife. Per sisters request empty wine bottle/box were thrown in the trash.

## 2020-07-16 NOTE — Progress Notes (Addendum)
Belongings assessed with Abby, RN Included:  1 large empty wine bottle  1 empty box of wine  1 steak knife - logged in security  Multiple items of clothing  1 pair of tennis shoes  Phone - logged in security  1 backpack Bottle of ibuprofen Ear drops 1 dog collar  1 glasses case  1 hotel card 1 pair of silver hoop earrings Multiple eating utensils
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
## 2020-07-16 NOTE — Progress Notes (Signed)
RT NOTE: RT transported patient on ventilator from room 4N24 to CT and back to room 4N24 with no complications. Vitals are stable. RT will continue to monitor.

## 2020-07-16 NOTE — Progress Notes (Signed)
Spoke with Marissa RN re PICC placement.  States to hold on PICC placement until 07/29/2020 due to FFP infusion and need for frequent assessments.  Pt currently has TL femoral CVC and 3 PIV's for medication needs.

## 2020-07-16 NOTE — Progress Notes (Signed)
Pt's children and ex-husband updated and visited patient for a couple minutes. Updated them on her status and plan of care. Answered questions about her, within scope of practice. Other questions deferred for physician to answer.   Also updated patients sister via phone. Aware of status and plan.
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
## 2020-07-17 ENCOUNTER — Inpatient Hospital Stay (HOSPITAL_COMMUNITY): Payer: Self-pay

## 2020-07-17 ENCOUNTER — Inpatient Hospital Stay (HOSPITAL_COMMUNITY): Payer: Self-pay | Admitting: Anesthesiology

## 2020-07-17 ENCOUNTER — Encounter (HOSPITAL_COMMUNITY): Admission: EM | Disposition: E | Payer: Self-pay | Source: Home / Self Care

## 2020-07-17 DIAGNOSIS — Z66 Do not resuscitate: Secondary | ICD-10-CM

## 2020-07-17 DIAGNOSIS — S3991XD Unspecified injury of abdomen, subsequent encounter: Secondary | ICD-10-CM

## 2020-07-17 DIAGNOSIS — S3991XA Unspecified injury of abdomen, initial encounter: Secondary | ICD-10-CM

## 2020-07-17 HISTORY — PX: LAPAROTOMY: SHX154

## 2020-07-17 LAB — CBC WITH DIFFERENTIAL/PLATELET
Abs Immature Granulocytes: 0 10*3/uL (ref 0.00–0.07)
Basophils Absolute: 0.1 10*3/uL (ref 0.0–0.1)
Basophils Relative: 1 %
Eosinophils Absolute: 0 10*3/uL (ref 0.0–0.5)
Eosinophils Relative: 0 %
HCT: 25.2 % — ABNORMAL LOW (ref 36.0–46.0)
Hemoglobin: 8.1 g/dL — ABNORMAL LOW (ref 12.0–15.0)
Lymphocytes Relative: 25 %
Lymphs Abs: 3.3 10*3/uL (ref 0.7–4.0)
MCH: 30.9 pg (ref 26.0–34.0)
MCHC: 32.1 g/dL (ref 30.0–36.0)
MCV: 96.2 fL (ref 80.0–100.0)
Monocytes Absolute: 0.3 10*3/uL (ref 0.1–1.0)
Monocytes Relative: 2 %
Neutro Abs: 9.4 10*3/uL — ABNORMAL HIGH (ref 1.7–7.7)
Neutrophils Relative %: 72 %
Platelets: 99 10*3/uL — ABNORMAL LOW (ref 150–400)
RBC: 2.62 MIL/uL — ABNORMAL LOW (ref 3.87–5.11)
RDW: 19.7 % — ABNORMAL HIGH (ref 11.5–15.5)
WBC: 13 10*3/uL — ABNORMAL HIGH (ref 4.0–10.5)
nRBC: 1.5 % — ABNORMAL HIGH (ref 0.0–0.2)
nRBC: 7 /100 WBC — ABNORMAL HIGH

## 2020-07-17 LAB — APTT: aPTT: 34 seconds (ref 24–36)

## 2020-07-17 LAB — BPAM FFP
Blood Product Expiration Date: 202110132359
Blood Product Expiration Date: 202110142359
Blood Product Expiration Date: 202110142359
Blood Product Expiration Date: 202110142359
Blood Product Expiration Date: 202110142359
Blood Product Expiration Date: 202110142359
Blood Product Expiration Date: 202110142359
Blood Product Expiration Date: 202110142359
Blood Product Expiration Date: 202110142359
Blood Product Expiration Date: 202110192359
Blood Product Expiration Date: 202110142359
Blood Product Expiration Date: 202110142359
ISSUE DATE / TIME: 202110091715
ISSUE DATE / TIME: 202110091715
ISSUE DATE / TIME: 202110091741
ISSUE DATE / TIME: 202110091741
ISSUE DATE / TIME: 202110091741
ISSUE DATE / TIME: 202110091741
ISSUE DATE / TIME: 202110091840
ISSUE DATE / TIME: 202110091840
ISSUE DATE / TIME: 202110101801
ISSUE DATE / TIME: 202110102046
ISSUE DATE / TIME: 202110091840
ISSUE DATE / TIME: 202110091840
Unit Type and Rh: 5100
Unit Type and Rh: 5100
Unit Type and Rh: 5100
Unit Type and Rh: 5100
Unit Type and Rh: 600
Unit Type and Rh: 600
Unit Type and Rh: 6200
Unit Type and Rh: 6200
Unit Type and Rh: 6200
Unit Type and Rh: 6200
Unit Type and Rh: 5100
Unit Type and Rh: 5100

## 2020-07-17 LAB — PREPARE FRESH FROZEN PLASMA
Unit division: 0
Unit division: 0
Unit division: 0
Unit division: 0
Unit division: 0
Unit division: 0

## 2020-07-17 LAB — PREPARE CRYOPRECIPITATE: Unit division: 0

## 2020-07-17 LAB — COMPREHENSIVE METABOLIC PANEL
ALT: 315 U/L — ABNORMAL HIGH (ref 0–44)
AST: 2233 U/L — ABNORMAL HIGH (ref 15–41)
Albumin: 3.3 g/dL — ABNORMAL LOW (ref 3.5–5.0)
Alkaline Phosphatase: 72 U/L (ref 38–126)
Anion gap: 11 (ref 5–15)
BUN: 12 mg/dL (ref 6–20)
CO2: 20 mmol/L — ABNORMAL LOW (ref 22–32)
Calcium: 7.3 mg/dL — ABNORMAL LOW (ref 8.9–10.3)
Chloride: 116 mmol/L — ABNORMAL HIGH (ref 98–111)
Creatinine, Ser: 0.8 mg/dL (ref 0.44–1.00)
GFR, Estimated: >60 mL/min (ref >60)
Glucose, Bld: 98 mg/dL (ref 70–99)
Potassium: 5.5 mmol/L — ABNORMAL HIGH (ref 3.5–5.1)
Sodium: 147 mmol/L — ABNORMAL HIGH (ref 135–145)
Total Bilirubin: 1.9 mg/dL — ABNORMAL HIGH (ref 0.3–1.2)
Total Protein: 5.1 g/dL — ABNORMAL LOW (ref 6.5–8.1)

## 2020-07-17 LAB — BLOOD PRODUCT ORDER (VERBAL) VERIFICATION

## 2020-07-17 LAB — TRIGLYCERIDES: Triglycerides: 496 mg/dL — ABNORMAL HIGH (ref <150)

## 2020-07-17 LAB — PROTIME-INR
INR: 1.4 — ABNORMAL HIGH (ref 0.8–1.2)
Prothrombin Time: 16.4 seconds — ABNORMAL HIGH (ref 11.4–15.2)

## 2020-07-17 LAB — PREPARE RBC (CROSSMATCH)

## 2020-07-17 LAB — BPAM CRYOPRECIPITATE
Blood Product Expiration Date: 202110100155
ISSUE DATE / TIME: 202110100027
Unit Type and Rh: 5100

## 2020-07-17 LAB — MAGNESIUM: Magnesium: 1.4 mg/dL — ABNORMAL LOW (ref 1.7–2.4)

## 2020-07-17 LAB — PHOSPHORUS: Phosphorus: 2.4 mg/dL — ABNORMAL LOW (ref 2.5–4.6)

## 2020-07-17 SURGERY — LAPAROTOMY, EXPLORATORY
Anesthesia: General | Site: Abdomen

## 2020-07-17 MED ORDER — SODIUM CHLORIDE 0.9 % IV SOLN
INTRAVENOUS | Status: DC | PRN
  Administered 2020-07-17: 500 mL via INTRAVENOUS
  Administered 2020-07-25: 250 mL via INTRAVENOUS
  Administered 2020-07-31: 500 mL via INTRAVENOUS

## 2020-07-17 MED ORDER — SODIUM CHLORIDE 0.9 % IV SOLN: INTRAVENOUS | Status: DC

## 2020-07-17 MED ORDER — CHLORHEXIDINE GLUCONATE CLOTH 2 % EX PADS
6.0000 | MEDICATED_PAD | Freq: Every day | CUTANEOUS | Status: DC
  Administered 2020-07-17 – 2020-08-01 (×16): 6 via TOPICAL

## 2020-07-17 MED ORDER — CEFAZOLIN SODIUM-DEXTROSE 2-3 GM-%(50ML) IV SOLR
INTRAVENOUS | Status: DC | PRN
  Administered 2020-07-17: 2 g via INTRAVENOUS

## 2020-07-17 MED ORDER — FENTANYL CITRATE (PF) 250 MCG/5ML IJ SOLN
INTRAMUSCULAR | Status: AC
Start: 2020-07-17 — End: ?
  Filled 2020-07-17: qty 5

## 2020-07-17 MED ORDER — ROCURONIUM BROMIDE 10 MG/ML (PF) SYRINGE
PREFILLED_SYRINGE | INTRAVENOUS | Status: DC | PRN
  Administered 2020-07-17: 80 mg via INTRAVENOUS

## 2020-07-17 MED ORDER — LACTATED RINGERS IV SOLN: INTRAVENOUS | Status: DC | PRN

## 2020-07-17 MED ORDER — SODIUM CHLORIDE 0.9% IV SOLUTION: Freq: Once | INTRAVENOUS | Status: DC

## 2020-07-17 MED ORDER — VASOPRESSIN 20 UNIT/ML IV SOLN
INTRAVENOUS | Status: AC
  Filled 2020-07-17: qty 1

## 2020-07-17 MED ORDER — PROPOFOL 10 MG/ML IV BOLUS
INTRAVENOUS | Status: AC
  Filled 2020-07-17: qty 20

## 2020-07-17 MED ORDER — 0.9 % SODIUM CHLORIDE (POUR BTL) OPTIME
TOPICAL | Status: DC | PRN
  Administered 2020-07-17 (×3): 1000 mL

## 2020-07-17 MED ORDER — CEFAZOLIN SODIUM-DEXTROSE 2-4 GM/100ML-% IV SOLN
2.0000 g | Freq: Three times a day (TID) | INTRAVENOUS | Status: AC
  Administered 2020-07-17 – 2020-07-18 (×3): 2 g via INTRAVENOUS
  Filled 2020-07-17 (×3): qty 100

## 2020-07-17 MED ORDER — SODIUM CHLORIDE 0.45 % IV SOLN: INTRAVENOUS | Status: DC

## 2020-07-17 MED ORDER — ALBUMIN HUMAN 5 % IV SOLN
25.0000 g | Freq: Once | INTRAVENOUS | Status: AC
  Administered 2020-07-17: 25 g via INTRAVENOUS
  Filled 2020-07-17: qty 500

## 2020-07-17 MED ORDER — MAGNESIUM SULFATE 4 GM/100ML IV SOLN
4.0000 g | Freq: Once | INTRAVENOUS | Status: AC
  Administered 2020-07-17: 4 g via INTRAVENOUS
  Filled 2020-07-17: qty 100

## 2020-07-17 MED ORDER — MIDAZOLAM HCL 2 MG/2ML IJ SOLN
INTRAMUSCULAR | Status: AC
  Filled 2020-07-17: qty 2

## 2020-07-17 SURGICAL SUPPLY — 47 items
APL PRP STRL LF DISP 70% ISPRP (MISCELLANEOUS) ×1
BIOPATCH RED 1 DISK 7.0 (GAUZE/BANDAGES/DRESSINGS) ×2 IMPLANT
BLADE CLIPPER SURG (BLADE) IMPLANT
CANISTER SUCT 3000ML PPV (MISCELLANEOUS) ×2 IMPLANT
CHLORAPREP W/TINT 26 (MISCELLANEOUS) ×2 IMPLANT
COVER SURGICAL LIGHT HANDLE (MISCELLANEOUS) ×2 IMPLANT
COVER WAND RF STERILE (DRAPES) ×2 IMPLANT
DRAIN CHANNEL 19F RND (DRAIN) ×2 IMPLANT
DRAPE LAPAROSCOPIC ABDOMINAL (DRAPES) ×2 IMPLANT
DRAPE WARM FLUID 44X44 (DRAPES) ×2 IMPLANT
DRSG OPSITE POSTOP 4X10 (GAUZE/BANDAGES/DRESSINGS) ×2 IMPLANT
DRSG OPSITE POSTOP 4X8 (GAUZE/BANDAGES/DRESSINGS) IMPLANT
DRSG TEGADERM 4X4.75 (GAUZE/BANDAGES/DRESSINGS) ×2 IMPLANT
ELECT BLADE 6.5 EXT (BLADE) IMPLANT
ELECT CAUTERY BLADE 6.4 (BLADE) ×2 IMPLANT
ELECT REM PT RETURN 9FT ADLT (ELECTROSURGICAL) ×2
ELECTRODE REM PT RTRN 9FT ADLT (ELECTROSURGICAL) ×1 IMPLANT
EVACUATOR SILICONE 100CC (DRAIN) ×2 IMPLANT
GLOVE BIO SURGEON STRL SZ8 (GLOVE) ×4 IMPLANT
GLOVE BIOGEL PI IND STRL 8 (GLOVE) ×1 IMPLANT
GLOVE BIOGEL PI INDICATOR 8 (GLOVE) ×1
GOWN STRL REUS W/ TWL LRG LVL3 (GOWN DISPOSABLE) ×1 IMPLANT
GOWN STRL REUS W/ TWL XL LVL3 (GOWN DISPOSABLE) ×1 IMPLANT
GOWN STRL REUS W/TWL LRG LVL3 (GOWN DISPOSABLE) ×2
GOWN STRL REUS W/TWL XL LVL3 (GOWN DISPOSABLE) ×2
HANDLE SUCTION POOLE (INSTRUMENTS) ×1 IMPLANT
KIT BASIN OR (CUSTOM PROCEDURE TRAY) ×2 IMPLANT
KIT TURNOVER KIT B (KITS) ×2 IMPLANT
LIGASURE IMPACT 36 18CM CVD LR (INSTRUMENTS) IMPLANT
NS IRRIG 1000ML POUR BTL (IV SOLUTION) ×4 IMPLANT
PACK GENERAL/GYN (CUSTOM PROCEDURE TRAY) ×2 IMPLANT
PAD ARMBOARD 7.5X6 YLW CONV (MISCELLANEOUS) ×2 IMPLANT
PENCIL SMOKE EVACUATOR (MISCELLANEOUS) ×2 IMPLANT
SPECIMEN JAR LARGE (MISCELLANEOUS) IMPLANT
SPONGE LAP 18X18 RF (DISPOSABLE) ×2 IMPLANT
STAPLER VISISTAT 35W (STAPLE) ×2 IMPLANT
SUCTION POOLE HANDLE (INSTRUMENTS) ×2
SUT ETHILON 2 0 FS 18 (SUTURE) ×2 IMPLANT
SUT PDS AB 1 TP1 96 (SUTURE) ×4 IMPLANT
SUT SILK 2 0 SH CR/8 (SUTURE) ×2 IMPLANT
SUT SILK 2 0 TIES 10X30 (SUTURE) ×2 IMPLANT
SUT SILK 3 0 SH CR/8 (SUTURE) ×2 IMPLANT
SUT SILK 3 0 TIES 10X30 (SUTURE) ×2 IMPLANT
SYR BULB EAR ULCER 3OZ GRN STR (SYRINGE) ×2 IMPLANT
TOWEL GREEN STERILE (TOWEL DISPOSABLE) ×2 IMPLANT
TRAY FOLEY MTR SLVR 16FR STAT (SET/KITS/TRAYS/PACK) IMPLANT
YANKAUER SUCT BULB TIP NO VENT (SUCTIONS) ×2 IMPLANT
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
## 2020-07-17 NOTE — Transfer of Care (Signed)
Immediate Anesthesia Transfer of Care Note  Patient: Diana Foster  Procedure(s) Performed: EXPLORATORY LAPAROTOMY POSSIBLE ABDOMINAL CLOSURE (N/A Abdomen)  Patient Location: SICU  Anesthesia Type:General  Level of Consciousness: sedated and Patient remains intubated per anesthesia plan  Airway & Oxygen Therapy: Patient remains intubated per anesthesia plan and Patient placed on Ventilator (see vital sign flow sheet for setting)  Post-op Assessment: Report given to RN and Post -op Vital signs reviewed and stable  Post vital signs: Reviewed and stable  Last Vitals:  Vitals Value Taken Time  BP    Temp    Pulse    Resp    SpO2      Last Pain:  Vitals:   08/03/2020 0400  TempSrc: Axillary         Complications: No complications documented.
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
## 2020-07-17 NOTE — Progress Notes (Signed)
  Consult request received and full eval to follow later this am.  Right ankle bimalleolar fracture would benefit from ORIF when stability and soft tissue swelling allows.  Currently it is well reduced.  Myrene Galas, MD [facility], [facility] [phone]

## 2020-07-17 NOTE — Anesthesia Procedure Notes (Signed)
Arterial Line Insertion Start/End2021-11-06 8:05 AM, 08/12/2020 8:20 AM Performed by: Mellody Dance, MD, anesthesiologist  Preanesthetic checklist: patient identified, IV checked, site marked, risks and benefits discussed, surgical consent, monitors and equipment checked, pre-op evaluation, timeout performed and anesthesia consent Patient sedated Left, radial was placed Catheter size: 20 G Hand hygiene performed  and maximum sterile barriers used   Attempts: 1 Procedure performed using ultrasound guided technique. Ultrasound Notes:anatomy identified, needle tip was noted to be adjacent to the nerve/plexus identified and no ultrasound evidence of intravascular and/or intraneural injection Following insertion, Biopatch and dressing applied. Post procedure assessment: normal  Patient tolerated the procedure well with no immediate complications.

## 2020-07-17 NOTE — Progress Notes (Signed)
Patient ID: Diana Foster, female   DOB: Mar 16, 1971, 49 y.o.   MRN: [id number] I met with her sister at the bedside and updated her.  Georganna Skeans, MD, MPH, FACS Please use [email or web address] to contact on call provider
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
## 2020-07-17 NOTE — Anesthesia Postprocedure Evaluation (Signed)
Anesthesia Post Note  Patient: Diana Foster  Procedure(s) Performed: EXPLORATORY LAPAROTOMY; ABDOMINAL CLOSURE; REMOVAL OF WOUND VAC (N/A Abdomen)     Patient location during evaluation: SICU Anesthesia Type: General Level of consciousness: sedated Pain management: pain level controlled Vital Signs Assessment: post-procedure vital signs reviewed and stable Respiratory status: patient remains intubated per anesthesia plan Cardiovascular status: stable (on Levophed) Postop Assessment: no apparent nausea or vomiting Anesthetic complications: no   No complications documented.  Last Vitals:  Vitals:   2020-07-26 1518 07/26/2020 1600  BP:    Pulse:    Resp:  15  Temp:  (!) 38.2 C  SpO2: 100% 100%    Last Pain:  Vitals:   07-26-2020 1600  TempSrc: Axillary                 Diana Foster

## 2020-07-17 NOTE — Progress Notes (Signed)
Orthopedic Tech Progress Note Patient Details:  Diana Foster May 05, 1971 [id number] I held while other ORTHO wrapped leg Ortho Devices Type of Ortho Device: Stirrup splint, Cotton web roll Ortho Device/Splint Location: RLE Ortho Device/Splint Interventions: Ordered, Application   Post Interventions Patient Tolerated: Well Instructions Provided: Care of device   Donald Pore 08/05/2020, 1:12 PM

## 2020-07-18 ENCOUNTER — Encounter (HOSPITAL_COMMUNITY): Admission: EM | Disposition: E | Payer: Self-pay | Source: Home / Self Care

## 2020-07-18 ENCOUNTER — Inpatient Hospital Stay (HOSPITAL_COMMUNITY): Payer: Self-pay

## 2020-07-18 ENCOUNTER — Inpatient Hospital Stay (HOSPITAL_COMMUNITY): Payer: Self-pay | Admitting: Certified Registered"

## 2020-07-18 ENCOUNTER — Encounter (HOSPITAL_COMMUNITY): Payer: Self-pay | Admitting: General Surgery

## 2020-07-18 HISTORY — PX: I & D EXTREMITY: SHX5045

## 2020-07-18 HISTORY — PX: ORIF ANKLE FRACTURE: SHX5408

## 2020-07-18 LAB — CBC
HCT: 30.4 % — ABNORMAL LOW (ref 36.0–46.0)
Hemoglobin: 9.5 g/dL — ABNORMAL LOW (ref 12.0–15.0)
MCH: 30.6 pg (ref 26.0–34.0)
MCHC: 31.3 g/dL (ref 30.0–36.0)
MCV: 98.1 fL (ref 80.0–100.0)
Platelets: 101 10*3/uL — ABNORMAL LOW (ref 150–400)
RBC: 3.1 MIL/uL — ABNORMAL LOW (ref 3.87–5.11)
RDW: 18.3 % — ABNORMAL HIGH (ref 11.5–15.5)
WBC: 9 10*3/uL (ref 4.0–10.5)
nRBC: 5.8 % — ABNORMAL HIGH (ref 0.0–0.2)

## 2020-07-18 LAB — BASIC METABOLIC PANEL
Anion gap: 13 (ref 5–15)
BUN: 16 mg/dL (ref 6–20)
CO2: 17 mmol/L — ABNORMAL LOW (ref 22–32)
Calcium: 7.2 mg/dL — ABNORMAL LOW (ref 8.9–10.3)
Chloride: 116 mmol/L — ABNORMAL HIGH (ref 98–111)
Creatinine, Ser: 0.79 mg/dL (ref 0.44–1.00)
GFR, Estimated: >60 mL/min (ref >60)
Glucose, Bld: 99 mg/dL (ref 70–99)
Potassium: 3.7 mmol/L (ref 3.5–5.1)
Sodium: 146 mmol/L — ABNORMAL HIGH (ref 135–145)

## 2020-07-18 LAB — HEPATIC FUNCTION PANEL
ALT: 229 U/L — ABNORMAL HIGH (ref 0–44)
AST: 1265 U/L — ABNORMAL HIGH (ref 15–41)
Albumin: 2.7 g/dL — ABNORMAL LOW (ref 3.5–5.0)
Alkaline Phosphatase: 68 U/L (ref 38–126)
Bilirubin, Direct: 1.5 mg/dL — ABNORMAL HIGH (ref 0.0–0.2)
Indirect Bilirubin: 0.9 mg/dL (ref 0.3–0.9)
Total Bilirubin: 2.4 mg/dL — ABNORMAL HIGH (ref 0.3–1.2)
Total Protein: 4.6 g/dL — ABNORMAL LOW (ref 6.5–8.1)

## 2020-07-18 LAB — SURGICAL PCR SCREEN
MRSA, PCR: NEGATIVE
Staphylococcus aureus: POSITIVE — AB

## 2020-07-18 LAB — MAGNESIUM: Magnesium: 2.5 mg/dL — ABNORMAL HIGH (ref 1.7–2.4)

## 2020-07-18 LAB — SURGICAL PATHOLOGY

## 2020-07-18 LAB — TRIGLYCERIDES: Triglycerides: 348 mg/dL — ABNORMAL HIGH (ref <150)

## 2020-07-18 SURGERY — OPEN REDUCTION INTERNAL FIXATION (ORIF) ANKLE FRACTURE
Anesthesia: General | Site: Ankle | Laterality: Right

## 2020-07-18 MED ORDER — VANCOMYCIN HCL 1000 MG IV SOLR
INTRAVENOUS | Status: DC | PRN
  Administered 2020-07-18: 1000 mg via TOPICAL

## 2020-07-18 MED ORDER — VANCOMYCIN HCL 500 MG IV SOLR
INTRAVENOUS | Status: AC
  Filled 2020-07-18: qty 500

## 2020-07-18 MED ORDER — SODIUM CHLORIDE 0.9% FLUSH
10.0000 mL | Freq: Two times a day (BID) | INTRAVENOUS | Status: DC
  Administered 2020-07-18 – 2020-07-22 (×9): 10 mL
  Administered 2020-07-23: 20 mL
  Administered 2020-07-24: 10 mL
  Administered 2020-07-24: 20 mL
  Administered 2020-07-25: 30 mL
  Administered 2020-07-25: 10 mL
  Administered 2020-07-26: 20 mL
  Administered 2020-07-26: 10 mL
  Administered 2020-07-26: 20 mL
  Administered 2020-07-27 – 2020-08-02 (×12): 10 mL

## 2020-07-18 MED ORDER — 0.9 % SODIUM CHLORIDE (POUR BTL) OPTIME
TOPICAL | Status: DC | PRN
  Administered 2020-07-18: 1000 mL

## 2020-07-18 MED ORDER — SODIUM CHLORIDE 0.9% FLUSH: 10.0000 mL | INTRAVENOUS | Status: DC | PRN

## 2020-07-18 MED ORDER — SODIUM CHLORIDE 0.9 % IR SOLN
Status: DC | PRN
  Administered 2020-07-18: 6000 mL

## 2020-07-18 MED ORDER — CEFAZOLIN SODIUM-DEXTROSE 2-4 GM/100ML-% IV SOLN
2.0000 g | Freq: Three times a day (TID) | INTRAVENOUS | Status: AC
  Administered 2020-07-18 – 2020-07-19 (×3): 2 g via INTRAVENOUS
  Filled 2020-07-18 (×3): qty 100

## 2020-07-18 MED ORDER — LACTATED RINGERS IV SOLN: INTRAVENOUS | Status: DC | PRN

## 2020-07-18 MED ORDER — VANCOMYCIN HCL 1000 MG IV SOLR
INTRAVENOUS | Status: AC
  Filled 2020-07-18: qty 1000

## 2020-07-18 MED ORDER — SODIUM CHLORIDE 0.9% FLUSH
10.0000 mL | Freq: Two times a day (BID) | INTRAVENOUS | Status: DC
  Administered 2020-07-18: 10 mL

## 2020-07-18 MED ORDER — ROCURONIUM BROMIDE 10 MG/ML (PF) SYRINGE
PREFILLED_SYRINGE | INTRAVENOUS | Status: DC | PRN
  Administered 2020-07-18: 20 mg via INTRAVENOUS
  Administered 2020-07-18: 40 mg via INTRAVENOUS
  Administered 2020-07-18: 20 mg via INTRAVENOUS

## 2020-07-18 MED ORDER — ALBUMIN HUMAN 25 % IV SOLN
12.5000 g | Freq: Once | INTRAVENOUS | Status: AC
  Administered 2020-07-18: 12.5 g via INTRAVENOUS
  Filled 2020-07-18: qty 50

## 2020-07-18 MED ORDER — MUPIROCIN 2 % EX OINT
1.0000 "application " | TOPICAL_OINTMENT | Freq: Two times a day (BID) | CUTANEOUS | Status: AC
  Administered 2020-07-18 – 2020-07-22 (×10): 1 via NASAL
  Filled 2020-07-18: qty 22

## 2020-07-18 SURGICAL SUPPLY — 87 items
BANDAGE ESMARK 6X9 LF (GAUZE/BANDAGES/DRESSINGS) IMPLANT
BIT DRILL 2.4X140 LONG SOLID (BIT) ×2 IMPLANT
BIT DRILL CANNULTD 2.6 X 130MM (DRILL) ×1 IMPLANT
BIT DRILL SOLID 2.0 X 110MM (DRILL) ×1 IMPLANT
BNDG CMPR 9X6 STRL LF SNTH (GAUZE/BANDAGES/DRESSINGS)
BNDG COHESIVE 4X5 TAN STRL (GAUZE/BANDAGES/DRESSINGS) ×2 IMPLANT
BNDG ELASTIC 4X5.8 VLCR STR LF (GAUZE/BANDAGES/DRESSINGS) ×2 IMPLANT
BNDG ELASTIC 6X5.8 VLCR STR LF (GAUZE/BANDAGES/DRESSINGS) ×2 IMPLANT
BNDG ESMARK 6X9 LF (GAUZE/BANDAGES/DRESSINGS)
BNDG GAUZE ELAST 4 BULKY (GAUZE/BANDAGES/DRESSINGS) ×2 IMPLANT
BRUSH SCRUB EZ PLAIN DRY (MISCELLANEOUS) ×4 IMPLANT
COVER MAYO STAND STRL (DRAPES) ×2 IMPLANT
COVER SURGICAL LIGHT HANDLE (MISCELLANEOUS) ×2 IMPLANT
COVER WAND RF STERILE (DRAPES) IMPLANT
CUFF TOURN SGL QUICK 34 (TOURNIQUET CUFF)
CUFF TRNQT CYL 34X4.125X (TOURNIQUET CUFF) IMPLANT
DRAPE C-ARM 42X72 X-RAY (DRAPES) ×2 IMPLANT
DRAPE C-ARMOR (DRAPES) ×2 IMPLANT
DRAPE HALF SHEET 40X57 (DRAPES) ×2 IMPLANT
DRAPE U-SHAPE 47X51 STRL (DRAPES) ×2 IMPLANT
DRILL CANNULATED 2.6 X 130MM (DRILL) ×2
DRILL SOLID 2.0 X 110MM (DRILL) ×2
DRSG ADAPTIC 3X8 NADH LF (GAUZE/BANDAGES/DRESSINGS) IMPLANT
DRSG EMULSION OIL 3X3 NADH (GAUZE/BANDAGES/DRESSINGS) IMPLANT
DRSG MEPILEX BORDER 4X8 (GAUZE/BANDAGES/DRESSINGS) ×2 IMPLANT
DRSG MEPITEL 4X7.2 (GAUZE/BANDAGES/DRESSINGS) ×2 IMPLANT
ELECT REM PT RETURN 9FT ADLT (ELECTROSURGICAL) ×2
ELECTRODE REM PT RTRN 9FT ADLT (ELECTROSURGICAL) ×1 IMPLANT
GAUZE SPONGE 4X4 12PLY STRL (GAUZE/BANDAGES/DRESSINGS) ×2 IMPLANT
GLOVE BIO SURGEON STRL SZ7.5 (GLOVE) ×2 IMPLANT
GLOVE BIO SURGEON STRL SZ8 (GLOVE) ×2 IMPLANT
GLOVE BIOGEL PI IND STRL 7.5 (GLOVE) ×1 IMPLANT
GLOVE BIOGEL PI IND STRL 8 (GLOVE) ×1 IMPLANT
GLOVE BIOGEL PI IND STRL 9 (GLOVE) ×1 IMPLANT
GLOVE BIOGEL PI INDICATOR 7.5 (GLOVE) ×1
GLOVE BIOGEL PI INDICATOR 8 (GLOVE) ×1
GLOVE BIOGEL PI INDICATOR 9 (GLOVE) ×1
GOWN STRL REUS W/ TWL LRG LVL3 (GOWN DISPOSABLE) ×2 IMPLANT
GOWN STRL REUS W/ TWL XL LVL3 (GOWN DISPOSABLE) ×1 IMPLANT
GOWN STRL REUS W/TWL LRG LVL3 (GOWN DISPOSABLE) ×4
GOWN STRL REUS W/TWL XL LVL3 (GOWN DISPOSABLE) ×2
HANDPIECE INTERPULSE COAX TIP (DISPOSABLE)
IRRIG SUCT STRYKERFLOW 2 WTIP (MISCELLANEOUS) ×2
IRRIGATION SUCT STRKRFLW 2 WTP (MISCELLANEOUS) ×1 IMPLANT
K-WIRE SNGL END 1.2X150 (MISCELLANEOUS) ×10
KIT BASIN OR (CUSTOM PROCEDURE TRAY) ×2 IMPLANT
KIT TURNOVER KIT B (KITS) ×2 IMPLANT
KWIRE SNGL END 1.2X150 (MISCELLANEOUS) ×5 IMPLANT
MANIFOLD NEPTUNE II (INSTRUMENTS) ×2 IMPLANT
NEEDLE HYPO 21X1.5 SAFETY (NEEDLE) IMPLANT
NS IRRIG 1000ML POUR BTL (IV SOLUTION) ×2 IMPLANT
PACK GENERAL/GYN (CUSTOM PROCEDURE TRAY) ×2 IMPLANT
PACK ORTHO EXTREMITY (CUSTOM PROCEDURE TRAY) ×2 IMPLANT
PAD ARMBOARD 7.5X6 YLW CONV (MISCELLANEOUS) ×2 IMPLANT
PAD CAST 4YDX4 CTTN HI CHSV (CAST SUPPLIES) ×1 IMPLANT
PADDING CAST COTTON 4X4 STRL (CAST SUPPLIES) ×2
PADDING CAST COTTON 6X4 STRL (CAST SUPPLIES) ×2 IMPLANT
PLATE FIBULA 9HOLE ANATOMICAL (Plate) ×2 IMPLANT
SCREW 4.0X28 STRL (Screw) ×4 IMPLANT
SCREW LOCK PLATE R3 2.7X15 (Screw) ×2 IMPLANT
SCREW LOCK PLATE R3 2.7X16 (Screw) ×4 IMPLANT
SCREW LOCK PLATE R3 3.5X12 (Screw) ×4 IMPLANT
SCREW NL 12X2.7XNS GORILLA R (Screw) ×1 IMPLANT
SCREW NL 2.7X12 (Screw) ×2 IMPLANT
SCREW NON LOCKING 3.5X12 (Screw) ×2 IMPLANT
SCREW NON LOCKING 3.5X14 (Screw) ×2 IMPLANT
SET HNDPC FAN SPRY TIP SCT (DISPOSABLE) IMPLANT
SOL PREP POV-IOD 4OZ 10% (MISCELLANEOUS) ×2 IMPLANT
SOL PREP PROV IODINE SCRUB 4OZ (MISCELLANEOUS) ×2 IMPLANT
SPLINT PLASTER CAST XFAST 5X30 (CAST SUPPLIES) ×1 IMPLANT
SPLINT PLASTER XFAST SET 5X30 (CAST SUPPLIES) ×1
SPONGE LAP 18X18 RF (DISPOSABLE) ×2 IMPLANT
STAPLER VISISTAT 35W (STAPLE) IMPLANT
STOCKINETTE IMPERVIOUS 9X36 MD (GAUZE/BANDAGES/DRESSINGS) IMPLANT
SUCTION FRAZIER HANDLE 10FR (MISCELLANEOUS) ×2
SUCTION TUBE FRAZIER 10FR DISP (MISCELLANEOUS) ×1 IMPLANT
SUT ETHILON 2 0 FS 18 (SUTURE) ×2 IMPLANT
SUT ETHILON 3 0 PS 1 (SUTURE) ×2 IMPLANT
SUT PDS AB 2-0 CT1 27 (SUTURE) ×2 IMPLANT
SUT VIC AB 2-0 CT1 27 (SUTURE) ×2
SUT VIC AB 2-0 CT1 TAPERPNT 27 (SUTURE) ×1 IMPLANT
TOWEL GREEN STERILE (TOWEL DISPOSABLE) ×4 IMPLANT
TOWEL GREEN STERILE FF (TOWEL DISPOSABLE) ×2 IMPLANT
TUBE CONNECTING 12X1/4 (SUCTIONS) ×2 IMPLANT
UNDERPAD 30X36 HEAVY ABSORB (UNDERPADS AND DIAPERS) ×2 IMPLANT
WATER STERILE IRR 1000ML POUR (IV SOLUTION) IMPLANT
YANKAUER SUCT BULB TIP NO VENT (SUCTIONS) ×4 IMPLANT
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
## 2020-07-18 NOTE — Progress Notes (Signed)
Patient remains intubated and sedated. Sister at bedside this am with Dr. Janee Morn. No new concerns. Sufficiently stable to go to OR for I&D and repair of right ankle bimalleolar fracture. Consent has been provided by sister.  Myrene Galas, MD [facility], [facility] [phone]

## 2020-07-18 NOTE — Transfer of Care (Signed)
Immediate Anesthesia Transfer of Care Note  Patient: Mallissa D Paris  Procedure(s) Performed: OPEN REDUCTION INTERNAL FIXATION (ORIF) ANKLE FRACTURE (Right Ankle) IRRIGATION AND DEBRIDEMENT EXTREMITY (Right Ankle)  Patient Location: SICU  Anesthesia Type:General  Level of Consciousness: sedated and Patient remains intubated per anesthesia plan  Airway & Oxygen Therapy: Patient remains intubated per anesthesia plan and Patient placed on Ventilator (see vital sign flow sheet for setting)  Post-op Assessment: Report given to RN and Post -op Vital signs reviewed and stable  Post vital signs: Reviewed and stable  Last Vitals:  Vitals Value Taken Time  BP 99/70 07/19/2020 1400  Temp    Pulse 87 08/03/2020 1403  Resp 15 07/31/2020 1403  SpO2 89 % 08/06/2020 1403  Vitals shown include unvalidated device data.  Last Pain:  Vitals:   07/21/2020 0800  TempSrc: Axillary         Complications: No complications documented.

## 2020-07-18 NOTE — OR Nursing (Signed)
Drain emptied upon OR arrival with 50 cc of brown fluid

## 2020-07-18 NOTE — Anesthesia Postprocedure Evaluation (Signed)
Anesthesia Post Note  Patient: Diana Foster  Procedure(s) Performed: OPEN REDUCTION INTERNAL FIXATION (ORIF) ANKLE FRACTURE (Right Ankle) IRRIGATION AND DEBRIDEMENT EXTREMITY (Right Ankle)     Patient location during evaluation: SICU Anesthesia Type: General Level of consciousness: sedated Pain management: pain level controlled Vital Signs Assessment: post-procedure vital signs reviewed and stable Respiratory status: patient remains intubated per anesthesia plan Cardiovascular status: stable Postop Assessment: no apparent nausea or vomiting Anesthetic complications: no   No complications documented.  Last Vitals:  Vitals:   07/27/2020 1000 07/17/2020 1100  BP: 91/70 94/71  Pulse: 75 76  Resp: 14 15  Temp:    SpO2: 100% 100%    Last Pain:  Vitals:   07/31/2020 0800  TempSrc: Axillary                 Candra R Delayla Hoffmaster

## 2020-07-18 NOTE — TOC CAGE-AID Note (Signed)
Transition of Care [facility]) - CAGE-AID Screening   Patient Details  Name: Diana Foster MRN: [id number] Date of Birth: 03-24-1971  [facility] CM/SW Contact:    Jimmy Picket, LCSWA Phone Number: 07/28/2020, 9:01 AM   Clinical Narrative:  Pt unable to participate in assessment due to being on ventilator. Pts blood alcohol was .321, please re consult when medically stable.   CAGE-AID Screening: Substance Abuse Screening unable to be completed due to: : Patient unable to participate               Isabella Stalling Clinical Social Worker [phone]

## 2020-07-18 NOTE — Progress Notes (Signed)
Peripherally Inserted Central Catheter Placement  The IV Nurse has discussed with the patient and/or persons authorized to consent for the patient, the purpose of this procedure and the potential benefits and risks involved with this procedure.  The benefits include less needle sticks, lab draws from the catheter, and the patient may be discharged home with the catheter. Risks include, but not limited to, infection, bleeding, blood clot (thrombus formation), and puncture of an artery; nerve damage and irregular heartbeat and possibility to perform a PICC exchange if needed/ordered by physician.  Alternatives to this procedure were also discussed.  Bard Power PICC patient education guide, fact sheet on infection prevention and patient information card has been provided to patient /or left at bedside.  Consent signed by sister  PICC Placement Documentation  PICC Triple Lumen 07/17/2020 PICC Right Brachial 37 cm 0 cm (Active)  Indication for Insertion or Continuance of Line Vasoactive infusions 07/29/2020 2105  Exposed Catheter (cm) 0 cm 07/17/2020 2105  Site Assessment Clean;Dry;Intact 08/06/2020 2105  Lumen #1 Status Flushed;Saline locked;Blood return noted 07/12/2020 2105  Lumen #2 Status Flushed;Saline locked;Blood return noted 07/09/2020 2105  Lumen #3 Status Flushed;Saline locked;Blood return noted 07/12/2020 2105  Dressing Type Transparent 07/20/2020 2105  Dressing Status Clean;Dry;Intact 08/01/2020 2105  Antimicrobial disc in place? Yes 08/06/2020 2105  Safety Lock Not Applicable 08/01/2020 2105  Dressing Intervention New dressing 07/07/2020 2105  Dressing Change Due 07/25/20 07/17/2020 2105       Ethelda Chick 07/13/2020, 9:06 PM

## 2020-07-19 ENCOUNTER — Inpatient Hospital Stay (HOSPITAL_COMMUNITY): Payer: Self-pay

## 2020-07-19 ENCOUNTER — Encounter (HOSPITAL_COMMUNITY): Payer: Self-pay | Admitting: Orthopedic Surgery

## 2020-07-19 DIAGNOSIS — Z7189 Other specified counseling: Secondary | ICD-10-CM

## 2020-07-19 LAB — TYPE AND SCREEN
ABO/RH(D): O POS
Antibody Screen: NEGATIVE
Unit division: 0
Unit division: 0
Unit division: 0
Unit division: 0
Unit division: 0
Unit division: 0
Unit division: 0
Unit division: 0
Unit division: 0
Unit division: 0
Unit division: 0
Unit division: 0
Unit division: 0
Unit division: 0

## 2020-07-19 LAB — BPAM RBC
Blood Product Expiration Date: 202110132359
Blood Product Expiration Date: 202110172359
Blood Product Expiration Date: 202110172359
Blood Product Expiration Date: 202110192359
Blood Product Expiration Date: 202110192359
Blood Product Expiration Date: 202110232359
Blood Product Expiration Date: 202110252359
Blood Product Expiration Date: 202110262359
Blood Product Expiration Date: 202110262359
Blood Product Expiration Date: 202110282359
Blood Product Expiration Date: 202111052359
Blood Product Expiration Date: 202111052359
Blood Product Expiration Date: 202111052359
Blood Product Expiration Date: 202111052359
ISSUE DATE / TIME: 202110091650
ISSUE DATE / TIME: 202110091714
ISSUE DATE / TIME: 202110091742
ISSUE DATE / TIME: 202110091742
ISSUE DATE / TIME: 202110091742
ISSUE DATE / TIME: 202110091751
ISSUE DATE / TIME: 202110091751
ISSUE DATE / TIME: 202110091751
ISSUE DATE / TIME: 202110101448
ISSUE DATE / TIME: 202110101810
ISSUE DATE / TIME: 202110110730
ISSUE DATE / TIME: 202110110730
Unit Type and Rh: 5100
Unit Type and Rh: 5100
Unit Type and Rh: 5100
Unit Type and Rh: 5100
Unit Type and Rh: 5100
Unit Type and Rh: 5100
Unit Type and Rh: 9500
Unit Type and Rh: 9500
Unit Type and Rh: 9500
Unit Type and Rh: 9500
Unit Type and Rh: 9500
Unit Type and Rh: 9500
Unit Type and Rh: 9500
Unit Type and Rh: 9500

## 2020-07-19 LAB — CBC
HCT: 31 % — ABNORMAL LOW (ref 36.0–46.0)
Hemoglobin: 9.5 g/dL — ABNORMAL LOW (ref 12.0–15.0)
MCH: 30.4 pg (ref 26.0–34.0)
MCHC: 30.6 g/dL (ref 30.0–36.0)
MCV: 99.4 fL (ref 80.0–100.0)
Platelets: 88 10*3/uL — ABNORMAL LOW (ref 150–400)
RBC: 3.12 MIL/uL — ABNORMAL LOW (ref 3.87–5.11)
RDW: 18.2 % — ABNORMAL HIGH (ref 11.5–15.5)
WBC: 6.1 10*3/uL (ref 4.0–10.5)
nRBC: 21.3 % — ABNORMAL HIGH (ref 0.0–0.2)

## 2020-07-19 LAB — GLUCOSE, CAPILLARY
Glucose-Capillary: 105 mg/dL — ABNORMAL HIGH (ref 70–99)
Glucose-Capillary: 102 mg/dL — ABNORMAL HIGH (ref 70–99)
Glucose-Capillary: 103 mg/dL — ABNORMAL HIGH (ref 70–99)

## 2020-07-19 LAB — COMPREHENSIVE METABOLIC PANEL
ALT: 149 U/L — ABNORMAL HIGH (ref 0–44)
AST: 614 U/L — ABNORMAL HIGH (ref 15–41)
Albumin: 2.7 g/dL — ABNORMAL LOW (ref 3.5–5.0)
Alkaline Phosphatase: 63 U/L (ref 38–126)
Anion gap: 9 (ref 5–15)
BUN: 20 mg/dL (ref 6–20)
CO2: 20 mmol/L — ABNORMAL LOW (ref 22–32)
Calcium: 7.3 mg/dL — ABNORMAL LOW (ref 8.9–10.3)
Chloride: 113 mmol/L — ABNORMAL HIGH (ref 98–111)
Creatinine, Ser: 0.79 mg/dL (ref 0.44–1.00)
GFR, Estimated: >60 mL/min (ref >60)
Glucose, Bld: 136 mg/dL — ABNORMAL HIGH (ref 70–99)
Potassium: 4 mmol/L (ref 3.5–5.1)
Sodium: 142 mmol/L (ref 135–145)
Total Bilirubin: 3.6 mg/dL — ABNORMAL HIGH (ref 0.3–1.2)
Total Protein: 4.8 g/dL — ABNORMAL LOW (ref 6.5–8.1)

## 2020-07-19 LAB — TRIGLYCERIDES: Triglycerides: 202 mg/dL — ABNORMAL HIGH (ref <150)

## 2020-07-19 MED ORDER — DEXMEDETOMIDINE HCL IN NACL 400 MCG/100ML IV SOLN
0.4000 ug/kg/h | INTRAVENOUS | Status: DC
  Administered 2020-07-19: 0.7 ug/kg/h via INTRAVENOUS
  Administered 2020-07-19: 0.6 ug/kg/h via INTRAVENOUS
  Administered 2020-07-19: 1 ug/kg/h via INTRAVENOUS
  Administered 2020-07-20 – 2020-07-21 (×2): 0.4 ug/kg/h via INTRAVENOUS
  Filled 2020-07-19 (×3): qty 100

## 2020-07-19 MED ORDER — ALBUMIN HUMAN 5 % IV SOLN
12.5000 g | Freq: Once | INTRAVENOUS | Status: AC
  Administered 2020-07-19: 12.5 g via INTRAVENOUS
  Filled 2020-07-19: qty 250

## 2020-07-19 MED ORDER — PROSOURCE TF PO LIQD
45.0000 mL | Freq: Every day | ORAL | Status: DC
  Administered 2020-07-20 – 2020-07-28 (×8): 45 mL
  Filled 2020-07-19 (×8): qty 45

## 2020-07-19 MED ORDER — PROSOURCE TF PO LIQD
45.0000 mL | Freq: Two times a day (BID) | ORAL | Status: DC
  Filled 2020-07-19: qty 45

## 2020-07-19 MED ORDER — PIVOT 1.5 CAL PO LIQD
1000.0000 mL | ORAL | Status: DC
  Administered 2020-07-19 – 2020-07-26 (×4): 1000 mL
  Filled 2020-07-19 (×2): qty 1000

## 2020-07-19 MED ORDER — ENOXAPARIN SODIUM 30 MG/0.3ML ~~LOC~~ SOLN
30.0000 mg | Freq: Two times a day (BID) | SUBCUTANEOUS | Status: DC
  Administered 2020-07-19 – 2020-08-01 (×26): 30 mg via SUBCUTANEOUS
  Filled 2020-07-19 (×26): qty 0.3

## 2020-07-19 MED ORDER — VITAL HIGH PROTEIN PO LIQD: 1000.0000 mL | ORAL | Status: DC
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
## 2020-07-19 NOTE — Procedures (Signed)
Cortrak  Person Inserting Tube:  Ilyssa Grennan, RD Tube Type:  Cortrak - 43 inches Tube Location:  Left nare Initial Placement:  Stomach Secured by: Bridle Technique Used to Measure Tube Placement:  Documented cm marking at nare/ corner of mouth Cortrak Secured At:  63 cm   No x-ray is required. RN may begin using tube.   If the tube becomes dislodged please keep the tube and contact the Cortrak team at [email or web address] (password [email or web address]) for replacement.  If after hours and replacement cannot be delayed, place a NG tube and confirm placement with an abdominal x-ray.    Marnee Sherrard RD, LDN Clinical Nutrition Pager listed in AMION   
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
## 2020-07-20 LAB — TRIGLYCERIDES: Triglycerides: 184 mg/dL — ABNORMAL HIGH (ref <150)

## 2020-07-20 LAB — GLUCOSE, CAPILLARY
Glucose-Capillary: 122 mg/dL — ABNORMAL HIGH (ref 70–99)
Glucose-Capillary: 124 mg/dL — ABNORMAL HIGH (ref 70–99)
Glucose-Capillary: 129 mg/dL — ABNORMAL HIGH (ref 70–99)
Glucose-Capillary: 149 mg/dL — ABNORMAL HIGH (ref 70–99)
Glucose-Capillary: 168 mg/dL — ABNORMAL HIGH (ref 70–99)
Glucose-Capillary: 143 mg/dL — ABNORMAL HIGH (ref 70–99)

## 2020-07-20 LAB — CBC
HCT: 30.2 % — ABNORMAL LOW (ref 36.0–46.0)
Hemoglobin: 9.3 g/dL — ABNORMAL LOW (ref 12.0–15.0)
MCH: 30.8 pg (ref 26.0–34.0)
MCHC: 30.8 g/dL (ref 30.0–36.0)
MCV: 100 fL (ref 80.0–100.0)
Platelets: 72 10*3/uL — ABNORMAL LOW (ref 150–400)
RBC: 3.02 MIL/uL — ABNORMAL LOW (ref 3.87–5.11)
RDW: 18.3 % — ABNORMAL HIGH (ref 11.5–15.5)
WBC: 8.9 10*3/uL (ref 4.0–10.5)
nRBC: 27.3 % — ABNORMAL HIGH (ref 0.0–0.2)

## 2020-07-20 LAB — COMPREHENSIVE METABOLIC PANEL
ALT: 83 U/L — ABNORMAL HIGH (ref 0–44)
AST: 306 U/L — ABNORMAL HIGH (ref 15–41)
Albumin: 2.5 g/dL — ABNORMAL LOW (ref 3.5–5.0)
Alkaline Phosphatase: 61 U/L (ref 38–126)
Anion gap: 8 (ref 5–15)
BUN: 22 mg/dL — ABNORMAL HIGH (ref 6–20)
CO2: 21 mmol/L — ABNORMAL LOW (ref 22–32)
Calcium: 7.4 mg/dL — ABNORMAL LOW (ref 8.9–10.3)
Chloride: 113 mmol/L — ABNORMAL HIGH (ref 98–111)
Creatinine, Ser: 0.62 mg/dL (ref 0.44–1.00)
GFR, Estimated: >60 mL/min (ref >60)
Glucose, Bld: 132 mg/dL — ABNORMAL HIGH (ref 70–99)
Potassium: 3.6 mmol/L (ref 3.5–5.1)
Sodium: 142 mmol/L (ref 135–145)
Total Bilirubin: 5.5 mg/dL — ABNORMAL HIGH (ref 0.3–1.2)
Total Protein: 4.7 g/dL — ABNORMAL LOW (ref 6.5–8.1)

## 2020-07-20 LAB — MAGNESIUM: Magnesium: 2.5 mg/dL — ABNORMAL HIGH (ref 1.7–2.4)

## 2020-07-20 LAB — PATHOLOGIST SMEAR REVIEW

## 2020-07-20 LAB — PHOSPHORUS: Phosphorus: 1.1 mg/dL — ABNORMAL LOW (ref 2.5–4.6)

## 2020-07-20 MED ORDER — POTASSIUM PHOSPHATES 15 MMOLE/5ML IV SOLN
20.0000 mmol | Freq: Once | INTRAVENOUS | Status: AC
  Administered 2020-07-20: 20 mmol via INTRAVENOUS
  Filled 2020-07-20: qty 6.67

## 2020-07-20 MED ORDER — ALBUMIN HUMAN 5 % IV SOLN
12.5000 g | Freq: Once | INTRAVENOUS | Status: AC
  Administered 2020-07-20: 12.5 g via INTRAVENOUS
  Filled 2020-07-20: qty 250
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
## 2020-07-21 ENCOUNTER — Inpatient Hospital Stay (HOSPITAL_COMMUNITY): Payer: Self-pay

## 2020-07-21 DIAGNOSIS — S3991XA Unspecified injury of abdomen, initial encounter: Secondary | ICD-10-CM

## 2020-07-21 LAB — COMPREHENSIVE METABOLIC PANEL
ALT: 54 U/L — ABNORMAL HIGH (ref 0–44)
AST: 139 U/L — ABNORMAL HIGH (ref 15–41)
Albumin: 2.2 g/dL — ABNORMAL LOW (ref 3.5–5.0)
Alkaline Phosphatase: 71 U/L (ref 38–126)
Anion gap: 7 (ref 5–15)
BUN: 20 mg/dL (ref 6–20)
CO2: 23 mmol/L (ref 22–32)
Calcium: 7 mg/dL — ABNORMAL LOW (ref 8.9–10.3)
Chloride: 117 mmol/L — ABNORMAL HIGH (ref 98–111)
Creatinine, Ser: 0.43 mg/dL — ABNORMAL LOW (ref 0.44–1.00)
GFR, Estimated: >60 mL/min (ref >60)
Glucose, Bld: 156 mg/dL — ABNORMAL HIGH (ref 70–99)
Potassium: 3.7 mmol/L (ref 3.5–5.1)
Sodium: 147 mmol/L — ABNORMAL HIGH (ref 135–145)
Total Bilirubin: 5.2 mg/dL — ABNORMAL HIGH (ref 0.3–1.2)
Total Protein: 4.4 g/dL — ABNORMAL LOW (ref 6.5–8.1)

## 2020-07-21 LAB — CBC
HCT: 31.6 % — ABNORMAL LOW (ref 36.0–46.0)
Hemoglobin: 9.9 g/dL — ABNORMAL LOW (ref 12.0–15.0)
MCH: 31.4 pg (ref 26.0–34.0)
MCHC: 31.3 g/dL (ref 30.0–36.0)
MCV: 100.3 fL — ABNORMAL HIGH (ref 80.0–100.0)
Platelets: 74 10*3/uL — ABNORMAL LOW (ref 150–400)
RBC: 3.15 MIL/uL — ABNORMAL LOW (ref 3.87–5.11)
RDW: 19 % — ABNORMAL HIGH (ref 11.5–15.5)
WBC: 13.3 10*3/uL — ABNORMAL HIGH (ref 4.0–10.5)
nRBC: 14.1 % — ABNORMAL HIGH (ref 0.0–0.2)

## 2020-07-21 LAB — CULTURE, RESPIRATORY W GRAM STAIN: Culture: NORMAL

## 2020-07-21 LAB — GLUCOSE, CAPILLARY
Glucose-Capillary: 147 mg/dL — ABNORMAL HIGH (ref 70–99)
Glucose-Capillary: 144 mg/dL — ABNORMAL HIGH (ref 70–99)
Glucose-Capillary: 137 mg/dL — ABNORMAL HIGH (ref 70–99)
Glucose-Capillary: 130 mg/dL — ABNORMAL HIGH (ref 70–99)
Glucose-Capillary: 115 mg/dL — ABNORMAL HIGH (ref 70–99)
Glucose-Capillary: 130 mg/dL — ABNORMAL HIGH (ref 70–99)

## 2020-07-21 LAB — POCT I-STAT 7, (LYTES, BLD GAS, ICA,H+H)
Acid-base deficit: 3 mmol/L — ABNORMAL HIGH (ref 0.0–2.0)
Bicarbonate: 24.7 mmol/L (ref 20.0–28.0)
Calcium, Ion: 1.09 mmol/L — ABNORMAL LOW (ref 1.15–1.40)
HCT: 30 % — ABNORMAL LOW (ref 36.0–46.0)
Hemoglobin: 10.2 g/dL — ABNORMAL LOW (ref 12.0–15.0)
O2 Saturation: 26 %
Patient temperature: 100.1
Potassium: 3.8 mmol/L (ref 3.5–5.1)
Sodium: 147 mmol/L — ABNORMAL HIGH (ref 135–145)
TCO2: 26 mmol/L (ref 22–32)
pCO2 arterial: 60.7 mmHg — ABNORMAL HIGH (ref 32.0–48.0)
pH, Arterial: 7.221 — ABNORMAL LOW (ref 7.350–7.450)
pO2, Arterial: 22 mmHg — CL (ref 83.0–108.0)

## 2020-07-21 LAB — TRIGLYCERIDES: Triglycerides: 169 mg/dL — ABNORMAL HIGH (ref <150)

## 2020-07-21 LAB — PHOSPHORUS: Phosphorus: 1.6 mg/dL — ABNORMAL LOW (ref 2.5–4.6)

## 2020-07-21 LAB — MAGNESIUM: Magnesium: 2.4 mg/dL (ref 1.7–2.4)

## 2020-07-21 MED ORDER — POTASSIUM PHOSPHATES 15 MMOLE/5ML IV SOLN
45.0000 mmol | Freq: Once | INTRAVENOUS | Status: AC
  Administered 2020-07-21: 45 mmol via INTRAVENOUS
  Filled 2020-07-21: qty 15
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
## 2020-07-22 LAB — GLUCOSE, CAPILLARY
Glucose-Capillary: 117 mg/dL — ABNORMAL HIGH (ref 70–99)
Glucose-Capillary: 130 mg/dL — ABNORMAL HIGH (ref 70–99)
Glucose-Capillary: 121 mg/dL — ABNORMAL HIGH (ref 70–99)
Glucose-Capillary: 123 mg/dL — ABNORMAL HIGH (ref 70–99)
Glucose-Capillary: 135 mg/dL — ABNORMAL HIGH (ref 70–99)
Glucose-Capillary: 143 mg/dL — ABNORMAL HIGH (ref 70–99)

## 2020-07-22 LAB — COMPREHENSIVE METABOLIC PANEL
ALT: 44 U/L (ref 0–44)
AST: 95 U/L — ABNORMAL HIGH (ref 15–41)
Albumin: 2 g/dL — ABNORMAL LOW (ref 3.5–5.0)
Alkaline Phosphatase: 83 U/L (ref 38–126)
Anion gap: 7 (ref 5–15)
BUN: 21 mg/dL — ABNORMAL HIGH (ref 6–20)
CO2: 23 mmol/L (ref 22–32)
Calcium: 7.2 mg/dL — ABNORMAL LOW (ref 8.9–10.3)
Chloride: 114 mmol/L — ABNORMAL HIGH (ref 98–111)
Creatinine, Ser: 0.5 mg/dL (ref 0.44–1.00)
GFR, Estimated: >60 mL/min (ref >60)
Glucose, Bld: 140 mg/dL — ABNORMAL HIGH (ref 70–99)
Potassium: 4.7 mmol/L (ref 3.5–5.1)
Sodium: 144 mmol/L (ref 135–145)
Total Bilirubin: 5.4 mg/dL — ABNORMAL HIGH (ref 0.3–1.2)
Total Protein: 4.5 g/dL — ABNORMAL LOW (ref 6.5–8.1)

## 2020-07-22 LAB — CBC
HCT: 33.8 % — ABNORMAL LOW (ref 36.0–46.0)
Hemoglobin: 10.3 g/dL — ABNORMAL LOW (ref 12.0–15.0)
MCH: 30.7 pg (ref 26.0–34.0)
MCHC: 30.5 g/dL (ref 30.0–36.0)
MCV: 100.9 fL — ABNORMAL HIGH (ref 80.0–100.0)
Platelets: 115 10*3/uL — ABNORMAL LOW (ref 150–400)
RBC: 3.35 MIL/uL — ABNORMAL LOW (ref 3.87–5.11)
RDW: 19.6 % — ABNORMAL HIGH (ref 11.5–15.5)
WBC: 21.6 10*3/uL — ABNORMAL HIGH (ref 4.0–10.5)
nRBC: 14 % — ABNORMAL HIGH (ref 0.0–0.2)

## 2020-07-22 LAB — MAGNESIUM: Magnesium: 2.3 mg/dL (ref 1.7–2.4)

## 2020-07-22 LAB — PHOSPHORUS: Phosphorus: 1.6 mg/dL — ABNORMAL LOW (ref 2.5–4.6)

## 2020-07-22 MED ORDER — SODIUM PHOSPHATES 45 MMOLE/15ML IV SOLN
45.0000 mmol | Freq: Once | INTRAVENOUS | Status: AC
  Administered 2020-07-22: 45 mmol via INTRAVENOUS
  Filled 2020-07-22: qty 15

## 2020-07-22 MED ORDER — DEXMEDETOMIDINE HCL IN NACL 400 MCG/100ML IV SOLN
0.4000 ug/kg/h | INTRAVENOUS | Status: DC
  Administered 2020-07-22: 0.2 ug/kg/h via INTRAVENOUS
  Administered 2020-07-23 – 2020-07-25 (×5): 0.4 ug/kg/h via INTRAVENOUS
  Filled 2020-07-22 (×5): qty 100

## 2020-07-22 NOTE — Progress Notes (Signed)
PMT family meeting scheduled for 1:00 pm on 10/17.     Expecting 5 family members for the meeting:  April, Delton See.  Norvel Richards, PA-C Palliative Medicine Office:  [phone]
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
## 2020-07-23 ENCOUNTER — Inpatient Hospital Stay (HOSPITAL_COMMUNITY): Payer: Self-pay

## 2020-07-23 LAB — COMPREHENSIVE METABOLIC PANEL
ALT: 41 U/L (ref 0–44)
AST: 89 U/L — ABNORMAL HIGH (ref 15–41)
Albumin: 1.9 g/dL — ABNORMAL LOW (ref 3.5–5.0)
Alkaline Phosphatase: 88 U/L (ref 38–126)
Anion gap: 9 (ref 5–15)
BUN: 28 mg/dL — ABNORMAL HIGH (ref 6–20)
CO2: 22 mmol/L (ref 22–32)
Calcium: 7.5 mg/dL — ABNORMAL LOW (ref 8.9–10.3)
Chloride: 112 mmol/L — ABNORMAL HIGH (ref 98–111)
Creatinine, Ser: 0.57 mg/dL (ref 0.44–1.00)
GFR, Estimated: >60 mL/min (ref >60)
Glucose, Bld: 134 mg/dL — ABNORMAL HIGH (ref 70–99)
Potassium: 4.7 mmol/L (ref 3.5–5.1)
Sodium: 143 mmol/L (ref 135–145)
Total Bilirubin: 5 mg/dL — ABNORMAL HIGH (ref 0.3–1.2)
Total Protein: 4.3 g/dL — ABNORMAL LOW (ref 6.5–8.1)

## 2020-07-23 LAB — GLUCOSE, CAPILLARY
Glucose-Capillary: 138 mg/dL — ABNORMAL HIGH (ref 70–99)
Glucose-Capillary: 105 mg/dL — ABNORMAL HIGH (ref 70–99)
Glucose-Capillary: 114 mg/dL — ABNORMAL HIGH (ref 70–99)
Glucose-Capillary: 95 mg/dL (ref 70–99)
Glucose-Capillary: 82 mg/dL (ref 70–99)

## 2020-07-23 LAB — CBC
HCT: 34.3 % — ABNORMAL LOW (ref 36.0–46.0)
Hemoglobin: 10.8 g/dL — ABNORMAL LOW (ref 12.0–15.0)
MCH: 31.5 pg (ref 26.0–34.0)
MCHC: 31.5 g/dL (ref 30.0–36.0)
MCV: 100 fL (ref 80.0–100.0)
Platelets: 185 10*3/uL (ref 150–400)
RBC: 3.43 MIL/uL — ABNORMAL LOW (ref 3.87–5.11)
RDW: 20 % — ABNORMAL HIGH (ref 11.5–15.5)
WBC: 24.1 10*3/uL — ABNORMAL HIGH (ref 4.0–10.5)
nRBC: 14.3 % — ABNORMAL HIGH (ref 0.0–0.2)

## 2020-07-23 LAB — PHOSPHORUS: Phosphorus: 3.1 mg/dL (ref 2.5–4.6)

## 2020-07-23 MED ORDER — BISACODYL 10 MG RE SUPP
10.0000 mg | Freq: Once | RECTAL | Status: AC
  Administered 2020-07-23: 10 mg via RECTAL
  Filled 2020-07-23: qty 1

## 2020-07-23 MED ORDER — ACETAMINOPHEN 500 MG PO TABS
1000.0000 mg | ORAL_TABLET | Freq: Four times a day (QID) | ORAL | Status: DC | PRN
  Administered 2020-07-24 (×3): 1000 mg
  Filled 2020-07-23 (×3): qty 2

## 2020-07-23 MED ORDER — METHOCARBAMOL 500 MG PO TABS
1000.0000 mg | ORAL_TABLET | Freq: Three times a day (TID) | ORAL | Status: DC | PRN
  Administered 2020-07-26 – 2020-08-01 (×2): 1000 mg
  Filled 2020-07-23 (×3): qty 2

## 2020-07-23 MED ORDER — IBUPROFEN 100 MG/5ML PO SUSP
400.0000 mg | Freq: Four times a day (QID) | ORAL | Status: DC | PRN
  Administered 2020-07-23 – 2020-08-01 (×15): 400 mg
  Filled 2020-07-23 (×14): qty 20
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
## 2020-07-24 ENCOUNTER — Other Ambulatory Visit: Payer: Self-pay

## 2020-07-24 LAB — URINALYSIS, ROUTINE W REFLEX MICROSCOPIC
Glucose, UA: NEGATIVE mg/dL
Hgb urine dipstick: NEGATIVE
Ketones, ur: NEGATIVE mg/dL
Leukocytes,Ua: NEGATIVE
Nitrite: NEGATIVE
Protein, ur: NEGATIVE mg/dL
Specific Gravity, Urine: 1.02 (ref 1.005–1.030)
pH: 5 (ref 5.0–8.0)

## 2020-07-24 LAB — COMPREHENSIVE METABOLIC PANEL
ALT: 44 U/L (ref 0–44)
AST: 98 U/L — ABNORMAL HIGH (ref 15–41)
Albumin: 1.6 g/dL — ABNORMAL LOW (ref 3.5–5.0)
Alkaline Phosphatase: 81 U/L (ref 38–126)
Anion gap: 9 (ref 5–15)
BUN: 45 mg/dL — ABNORMAL HIGH (ref 6–20)
CO2: 21 mmol/L — ABNORMAL LOW (ref 22–32)
Calcium: 7.8 mg/dL — ABNORMAL LOW (ref 8.9–10.3)
Chloride: 112 mmol/L — ABNORMAL HIGH (ref 98–111)
Creatinine, Ser: 0.8 mg/dL (ref 0.44–1.00)
GFR, Estimated: >60 mL/min (ref >60)
Glucose, Bld: 108 mg/dL — ABNORMAL HIGH (ref 70–99)
Potassium: 5.4 mmol/L — ABNORMAL HIGH (ref 3.5–5.1)
Sodium: 142 mmol/L (ref 135–145)
Total Bilirubin: 5.7 mg/dL — ABNORMAL HIGH (ref 0.3–1.2)
Total Protein: 4.7 g/dL — ABNORMAL LOW (ref 6.5–8.1)

## 2020-07-24 LAB — GLUCOSE, CAPILLARY
Glucose-Capillary: 100 mg/dL — ABNORMAL HIGH (ref 70–99)
Glucose-Capillary: 115 mg/dL — ABNORMAL HIGH (ref 70–99)
Glucose-Capillary: 80 mg/dL (ref 70–99)
Glucose-Capillary: 84 mg/dL (ref 70–99)
Glucose-Capillary: 87 mg/dL (ref 70–99)
Glucose-Capillary: 99 mg/dL (ref 70–99)

## 2020-07-24 LAB — CBC
HCT: 35.6 % — ABNORMAL LOW (ref 36.0–46.0)
Hemoglobin: 10.8 g/dL — ABNORMAL LOW (ref 12.0–15.0)
MCH: 31 pg (ref 26.0–34.0)
MCHC: 30.3 g/dL (ref 30.0–36.0)
MCV: 102.3 fL — ABNORMAL HIGH (ref 80.0–100.0)
Platelets: ADEQUATE 10*3/uL (ref 150–400)
RBC: 3.48 MIL/uL — ABNORMAL LOW (ref 3.87–5.11)
RDW: 20.2 % — ABNORMAL HIGH (ref 11.5–15.5)
WBC: 23.7 10*3/uL — ABNORMAL HIGH (ref 4.0–10.5)
nRBC: 12.4 % — ABNORMAL HIGH (ref 0.0–0.2)

## 2020-07-24 LAB — MAGNESIUM: Magnesium: 2.6 mg/dL — ABNORMAL HIGH (ref 1.7–2.4)

## 2020-07-24 LAB — AMMONIA: Ammonia: 63 umol/L — ABNORMAL HIGH (ref 9–35)

## 2020-07-24 LAB — PROTIME-INR
INR: 1.2 (ref 0.8–1.2)
Prothrombin Time: 15 seconds (ref 11.4–15.2)

## 2020-07-24 MED ORDER — BISACODYL 10 MG RE SUPP
10.0000 mg | Freq: Once | RECTAL | Status: AC
  Administered 2020-07-24: 10 mg via RECTAL
  Filled 2020-07-24: qty 1

## 2020-07-24 MED ORDER — SENNOSIDES 8.8 MG/5ML PO SYRP
5.0000 mL | ORAL_SOLUTION | Freq: Two times a day (BID) | ORAL | Status: DC
  Filled 2020-07-24: qty 5

## 2020-07-24 MED ORDER — SENNOSIDES 8.8 MG/5ML PO SYRP
5.0000 mL | ORAL_SOLUTION | Freq: Two times a day (BID) | ORAL | Status: DC
  Administered 2020-07-24 – 2020-07-31 (×9): 5 mL
  Filled 2020-07-24 (×8): qty 5
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
## 2020-07-24 NOTE — Progress Notes (Signed)
Tracheal aspirate collected and sent to lab. 
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
## 2020-07-25 ENCOUNTER — Inpatient Hospital Stay (HOSPITAL_COMMUNITY): Payer: Self-pay

## 2020-07-25 DIAGNOSIS — R4182 Altered mental status, unspecified: Secondary | ICD-10-CM

## 2020-07-25 DIAGNOSIS — Z515 Encounter for palliative care: Secondary | ICD-10-CM

## 2020-07-25 DIAGNOSIS — Z7189 Other specified counseling: Secondary | ICD-10-CM

## 2020-07-25 LAB — CBC
HCT: 34.5 % — ABNORMAL LOW (ref 36.0–46.0)
Hemoglobin: 10.6 g/dL — ABNORMAL LOW (ref 12.0–15.0)
MCH: 31.5 pg (ref 26.0–34.0)
MCHC: 30.7 g/dL (ref 30.0–36.0)
MCV: 102.7 fL — ABNORMAL HIGH (ref 80.0–100.0)
Platelets: 326 10*3/uL (ref 150–400)
RBC: 3.36 MIL/uL — ABNORMAL LOW (ref 3.87–5.11)
RDW: 20.1 % — ABNORMAL HIGH (ref 11.5–15.5)
WBC: 26.3 10*3/uL — ABNORMAL HIGH (ref 4.0–10.5)
nRBC: 8.3 % — ABNORMAL HIGH (ref 0.0–0.2)

## 2020-07-25 LAB — COMPREHENSIVE METABOLIC PANEL
ALT: 43 U/L (ref 0–44)
AST: 97 U/L — ABNORMAL HIGH (ref 15–41)
Albumin: 1.5 g/dL — ABNORMAL LOW (ref 3.5–5.0)
Alkaline Phosphatase: 79 U/L (ref 38–126)
Anion gap: 7 (ref 5–15)
BUN: 46 mg/dL — ABNORMAL HIGH (ref 6–20)
CO2: 19 mmol/L — ABNORMAL LOW (ref 22–32)
Calcium: 6.7 mg/dL — ABNORMAL LOW (ref 8.9–10.3)
Chloride: 106 mmol/L (ref 98–111)
Creatinine, Ser: 0.56 mg/dL (ref 0.44–1.00)
GFR, Estimated: >60 mL/min (ref >60)
Glucose, Bld: 106 mg/dL — ABNORMAL HIGH (ref 70–99)
Potassium: 4.4 mmol/L (ref 3.5–5.1)
Sodium: 132 mmol/L — ABNORMAL LOW (ref 135–145)
Total Bilirubin: 5.4 mg/dL — ABNORMAL HIGH (ref 0.3–1.2)
Total Protein: 4 g/dL — ABNORMAL LOW (ref 6.5–8.1)

## 2020-07-25 LAB — GLUCOSE, CAPILLARY
Glucose-Capillary: 99 mg/dL (ref 70–99)
Glucose-Capillary: 107 mg/dL — ABNORMAL HIGH (ref 70–99)
Glucose-Capillary: 142 mg/dL — ABNORMAL HIGH (ref 70–99)
Glucose-Capillary: 134 mg/dL — ABNORMAL HIGH (ref 70–99)
Glucose-Capillary: 178 mg/dL — ABNORMAL HIGH (ref 70–99)
Glucose-Capillary: 192 mg/dL — ABNORMAL HIGH (ref 70–99)

## 2020-07-25 MED ORDER — PIPERACILLIN-TAZOBACTAM 3.375 G IVPB
3.3750 g | Freq: Three times a day (TID) | INTRAVENOUS | Status: DC
  Administered 2020-07-25 – 2020-07-26 (×3): 3.375 g via INTRAVENOUS
  Filled 2020-07-25 (×4): qty 50

## 2020-07-25 MED ORDER — ACETAMINOPHEN 500 MG PO TABS
1000.0000 mg | ORAL_TABLET | Freq: Four times a day (QID) | ORAL | Status: DC | PRN
  Administered 2020-07-25 – 2020-08-01 (×7): 1000 mg
  Filled 2020-07-25 (×7): qty 2

## 2020-07-25 MED ORDER — LACTULOSE 10 GM/15ML PO SOLN
10.0000 g | Freq: Two times a day (BID) | ORAL | Status: DC
  Administered 2020-07-25 – 2020-07-26 (×4): 10 g
  Filled 2020-07-25 (×4): qty 15

## 2020-07-25 MED ORDER — DEXMEDETOMIDINE HCL IN NACL 400 MCG/100ML IV SOLN
0.4000 ug/kg/h | INTRAVENOUS | Status: DC
  Administered 2020-07-25: 0.4 ug/kg/h via INTRAVENOUS
  Administered 2020-07-25: 0.3 ug/kg/h via INTRAVENOUS
  Administered 2020-07-26 (×2): 0.4 ug/kg/h via INTRAVENOUS
  Administered 2020-07-27: 0.7 ug/kg/h via INTRAVENOUS
  Filled 2020-07-25 (×5): qty 100

## 2020-07-25 MED ORDER — SODIUM CHLORIDE 0.9 % IV BOLUS
1000.0000 mL | Freq: Once | INTRAVENOUS | Status: AC
  Administered 2020-07-25: 1000 mL via INTRAVENOUS
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
## 2020-07-25 NOTE — Progress Notes (Signed)
EEG complete - results pending 
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
## 2020-07-25 NOTE — Procedures (Signed)
Patient Name: Diana Foster  MRN: [id number]  Epilepsy Attending: Charlsie Quest  Referring Physician/Provider: Kris Mouton, NP Date: 07/25/2020  Duration: 24.35 mins  Patient history: 49 year old female with altered mental status.  EEG 12 for seizures.  Level of alertness: comatose  AEDs during EEG study: None  Technical aspects: This EEG study was done with scalp electrodes positioned according to the 10-20 International system of electrode placement. Electrical activity was acquired at a sampling rate of 500Hz  and reviewed with a high frequency filter of 70Hz  and a low frequency filter of 1Hz . EEG data were recorded continuously and digitally stored.   Description: EEG showed continuous generalized 2-3Hz  rhythmic delta slowing.  EEG was reactive to noxious stimuli.  Hyperventilation and photic stimulation were not performed.     ABNORMALITY -Continuous rhythmic slow, generalized  IMPRESSION: This study is suggestive of severe diffuse encephalopathy, nonspecific etiology. No seizures or epileptiform discharges were seen throughout the recording.  Vear Staton 
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
## 2020-07-25 NOTE — Progress Notes (Signed)
8:18 PM  Patient hypotensive SBP 80s, MAP 40s, tachycardic HR 120-130s and RR 35. Notified Dr Janee Morn, received order for 1L NS bolus.
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
## 2020-07-26 ENCOUNTER — Inpatient Hospital Stay (HOSPITAL_COMMUNITY): Payer: Self-pay

## 2020-07-26 DIAGNOSIS — J9690 Respiratory failure, unspecified, unspecified whether with hypoxia or hypercapnia: Secondary | ICD-10-CM

## 2020-07-26 LAB — BLOOD CULTURE ID PANEL (REFLEXED) - BCID2

## 2020-07-26 LAB — COMPREHENSIVE METABOLIC PANEL
ALT: 69 U/L — ABNORMAL HIGH (ref 0–44)
AST: 132 U/L — ABNORMAL HIGH (ref 15–41)
Albumin: 1.6 g/dL — ABNORMAL LOW (ref 3.5–5.0)
Alkaline Phosphatase: 119 U/L (ref 38–126)
Anion gap: 9 (ref 5–15)
BUN: 62 mg/dL — ABNORMAL HIGH (ref 6–20)
CO2: 20 mmol/L — ABNORMAL LOW (ref 22–32)
Calcium: 7.5 mg/dL — ABNORMAL LOW (ref 8.9–10.3)
Chloride: 118 mmol/L — ABNORMAL HIGH (ref 98–111)
Creatinine, Ser: 0.93 mg/dL (ref 0.44–1.00)
GFR, Estimated: >60 mL/min (ref >60)
Glucose, Bld: 230 mg/dL — ABNORMAL HIGH (ref 70–99)
Potassium: 4.4 mmol/L (ref 3.5–5.1)
Sodium: 147 mmol/L — ABNORMAL HIGH (ref 135–145)
Total Bilirubin: 5.6 mg/dL — ABNORMAL HIGH (ref 0.3–1.2)
Total Protein: 4.8 g/dL — ABNORMAL LOW (ref 6.5–8.1)

## 2020-07-26 LAB — CBC
HCT: 36.3 % (ref 36.0–46.0)
Hemoglobin: 11 g/dL — ABNORMAL LOW (ref 12.0–15.0)
MCH: 31.8 pg (ref 26.0–34.0)
MCHC: 30.3 g/dL (ref 30.0–36.0)
MCV: 104.9 fL — ABNORMAL HIGH (ref 80.0–100.0)
Platelets: 416 10*3/uL — ABNORMAL HIGH (ref 150–400)
RBC: 3.46 MIL/uL — ABNORMAL LOW (ref 3.87–5.11)
RDW: 21.1 % — ABNORMAL HIGH (ref 11.5–15.5)
WBC: 37.4 10*3/uL — ABNORMAL HIGH (ref 4.0–10.5)
nRBC: 3.2 % — ABNORMAL HIGH (ref 0.0–0.2)

## 2020-07-26 LAB — GLUCOSE, CAPILLARY
Glucose-Capillary: 175 mg/dL — ABNORMAL HIGH (ref 70–99)
Glucose-Capillary: 194 mg/dL — ABNORMAL HIGH (ref 70–99)
Glucose-Capillary: 197 mg/dL — ABNORMAL HIGH (ref 70–99)
Glucose-Capillary: 154 mg/dL — ABNORMAL HIGH (ref 70–99)
Glucose-Capillary: 143 mg/dL — ABNORMAL HIGH (ref 70–99)
Glucose-Capillary: 143 mg/dL — ABNORMAL HIGH (ref 70–99)

## 2020-07-26 MED ORDER — INSULIN ASPART 100 UNIT/ML ~~LOC~~ SOLN
0.0000 [IU] | SUBCUTANEOUS | Status: DC
  Administered 2020-07-26: 3 [IU] via SUBCUTANEOUS
  Administered 2020-07-26 (×2): 2 [IU] via SUBCUTANEOUS
  Administered 2020-07-27 – 2020-07-28 (×5): 3 [IU] via SUBCUTANEOUS
  Administered 2020-07-28: 5 [IU] via SUBCUTANEOUS
  Administered 2020-07-28 (×2): 3 [IU] via SUBCUTANEOUS
  Administered 2020-07-28: 5 [IU] via SUBCUTANEOUS
  Administered 2020-07-28 – 2020-07-29 (×2): 3 [IU] via SUBCUTANEOUS
  Administered 2020-07-29 (×2): 2 [IU] via SUBCUTANEOUS
  Administered 2020-07-29: 3 [IU] via SUBCUTANEOUS
  Administered 2020-07-29 – 2020-07-31 (×9): 2 [IU] via SUBCUTANEOUS
  Administered 2020-07-31 (×2): 3 [IU] via SUBCUTANEOUS
  Administered 2020-07-31: 2 [IU] via SUBCUTANEOUS
  Administered 2020-07-31: 3 [IU] via SUBCUTANEOUS
  Administered 2020-08-01: 2 [IU] via SUBCUTANEOUS
  Administered 2020-08-01 (×2): 3 [IU] via SUBCUTANEOUS
  Administered 2020-08-01: 2 [IU] via SUBCUTANEOUS
  Administered 2020-08-01: 3 [IU] via SUBCUTANEOUS
  Administered 2020-08-02: 2 [IU] via SUBCUTANEOUS
  Administered 2020-08-02: 3 [IU] via SUBCUTANEOUS
  Administered 2020-08-02: 2 [IU] via SUBCUTANEOUS

## 2020-07-26 MED ORDER — PIPERACILLIN-TAZOBACTAM 3.375 G IVPB
3.3750 g | Freq: Three times a day (TID) | INTRAVENOUS | Status: DC
  Administered 2020-07-26 – 2020-07-28 (×6): 3.375 g via INTRAVENOUS
  Filled 2020-07-26 (×5): qty 50

## 2020-07-26 MED ORDER — PIPERACILLIN-TAZOBACTAM 3.375 G IVPB 30 MIN: 3.3750 g | Freq: Three times a day (TID) | INTRAVENOUS | Status: DC

## 2020-07-26 MED ORDER — IOHEXOL 300 MG/ML  SOLN
100.0000 mL | Freq: Once | INTRAMUSCULAR | Status: AC | PRN
  Administered 2020-07-26: 100 mL via INTRAVENOUS

## 2020-07-26 MED ORDER — BETHANECHOL CHLORIDE 10 MG PO TABS
25.0000 mg | ORAL_TABLET | Freq: Three times a day (TID) | ORAL | Status: DC
  Administered 2020-07-26 – 2020-08-01 (×18): 25 mg
  Filled 2020-07-26 (×18): qty 3
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
## 2020-07-26 NOTE — Plan of Care (Signed)
  Problem: Clinical Measurements: Goal: Respiratory complications will improve Outcome: Progressing Goal: Cardiovascular complication will be avoided Outcome: Progressing   Problem: Nutrition: Goal: Adequate nutrition will be maintained Outcome: Progressing   Problem: Skin Integrity: Goal: Risk for impaired skin integrity will decrease Outcome: Progressing   Problem: Nutrition: Goal: Adequate nutrition will be maintained Outcome: Progressing   Problem: Safety: Goal: Ability to remain free from injury will improve Outcome: Progressing   Problem: Skin Integrity: Goal: Risk for impaired skin integrity will decrease Outcome: Progressing   Problem: Activity: Goal: Will remain free from falls Outcome: Progressing   Problem: Respiratory: Goal: Ability to maintain adequate ventilation will improve Outcome: Progressing   Problem: Skin Integrity: Goal: Ability to maintain adequate tissue integrity will improve Outcome: Progressing   Problem: Respiratory: Goal: Ability to maintain a clear airway and adequate ventilation will improve Outcome: Progressing

## 2020-07-27 DIAGNOSIS — J189 Pneumonia, unspecified organism: Secondary | ICD-10-CM

## 2020-07-27 LAB — CBC
HCT: 32.1 % — ABNORMAL LOW (ref 36.0–46.0)
Hemoglobin: 9.7 g/dL — ABNORMAL LOW (ref 12.0–15.0)
MCH: 32 pg (ref 26.0–34.0)
MCHC: 30.2 g/dL (ref 30.0–36.0)
MCV: 105.9 fL — ABNORMAL HIGH (ref 80.0–100.0)
Platelets: 389 10*3/uL (ref 150–400)
RBC: 3.03 MIL/uL — ABNORMAL LOW (ref 3.87–5.11)
RDW: 21.2 % — ABNORMAL HIGH (ref 11.5–15.5)
WBC: 34.8 10*3/uL — ABNORMAL HIGH (ref 4.0–10.5)
nRBC: 2.6 % — ABNORMAL HIGH (ref 0.0–0.2)

## 2020-07-27 LAB — POCT I-STAT 7, (LYTES, BLD GAS, ICA,H+H)
Acid-base deficit: 3 mmol/L — ABNORMAL HIGH (ref 0.0–2.0)
Bicarbonate: 21.7 mmol/L (ref 20.0–28.0)
Calcium, Ion: 1.16 mmol/L (ref 1.15–1.40)
HCT: 34 % — ABNORMAL LOW (ref 36.0–46.0)
Hemoglobin: 11.6 g/dL — ABNORMAL LOW (ref 12.0–15.0)
O2 Saturation: 95 %
Patient temperature: 99.8
Potassium: 4.1 mmol/L (ref 3.5–5.1)
Sodium: 151 mmol/L — ABNORMAL HIGH (ref 135–145)
TCO2: 23 mmol/L (ref 22–32)
pCO2 arterial: 38.1 mmHg (ref 32.0–48.0)
pH, Arterial: 7.367 (ref 7.350–7.450)
pO2, Arterial: 78 mmHg — ABNORMAL LOW (ref 83.0–108.0)

## 2020-07-27 LAB — GLUCOSE, CAPILLARY
Glucose-Capillary: 176 mg/dL — ABNORMAL HIGH (ref 70–99)
Glucose-Capillary: 193 mg/dL — ABNORMAL HIGH (ref 70–99)
Glucose-Capillary: 183 mg/dL — ABNORMAL HIGH (ref 70–99)
Glucose-Capillary: 185 mg/dL — ABNORMAL HIGH (ref 70–99)
Glucose-Capillary: 179 mg/dL — ABNORMAL HIGH (ref 70–99)
Glucose-Capillary: 174 mg/dL — ABNORMAL HIGH (ref 70–99)

## 2020-07-27 LAB — COMPREHENSIVE METABOLIC PANEL
ALT: 91 U/L — ABNORMAL HIGH (ref 0–44)
AST: 152 U/L — ABNORMAL HIGH (ref 15–41)
Albumin: 1.5 g/dL — ABNORMAL LOW (ref 3.5–5.0)
Alkaline Phosphatase: 131 U/L — ABNORMAL HIGH (ref 38–126)
Anion gap: 10 (ref 5–15)
BUN: 42 mg/dL — ABNORMAL HIGH (ref 6–20)
CO2: 20 mmol/L — ABNORMAL LOW (ref 22–32)
Calcium: 7.6 mg/dL — ABNORMAL LOW (ref 8.9–10.3)
Chloride: 118 mmol/L — ABNORMAL HIGH (ref 98–111)
Creatinine, Ser: 0.61 mg/dL (ref 0.44–1.00)
GFR, Estimated: >60 mL/min (ref >60)
Glucose, Bld: 197 mg/dL — ABNORMAL HIGH (ref 70–99)
Potassium: 3.9 mmol/L (ref 3.5–5.1)
Sodium: 148 mmol/L — ABNORMAL HIGH (ref 135–145)
Total Bilirubin: 5 mg/dL — ABNORMAL HIGH (ref 0.3–1.2)
Total Protein: 4.8 g/dL — ABNORMAL LOW (ref 6.5–8.1)

## 2020-07-27 LAB — CULTURE, RESPIRATORY W GRAM STAIN: Culture: NORMAL

## 2020-07-27 LAB — AMMONIA: Ammonia: 58 umol/L — ABNORMAL HIGH (ref 9–35)

## 2020-07-27 MED ORDER — LACTULOSE 10 GM/15ML PO SOLN
20.0000 g | Freq: Two times a day (BID) | ORAL | Status: AC
  Administered 2020-07-27 (×2): 20 g
  Filled 2020-07-27 (×2): qty 30

## 2020-07-27 MED ORDER — LACTULOSE 10 GM/15ML PO SOLN
10.0000 g | Freq: Two times a day (BID) | ORAL | Status: DC
  Administered 2020-07-28 – 2020-07-31 (×7): 10 g
  Filled 2020-07-27 (×7): qty 15

## 2020-07-27 MED ORDER — NOREPINEPHRINE 4 MG/250ML-% IV SOLN
0.0000 ug/min | INTRAVENOUS | Status: DC
  Filled 2020-07-27: qty 250

## 2020-07-27 MED ORDER — ALBUMIN HUMAN 5 % IV SOLN
25.0000 g | Freq: Once | INTRAVENOUS | Status: AC
  Administered 2020-07-27: 25 g via INTRAVENOUS
  Filled 2020-07-27: qty 500
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
## 2020-07-27 NOTE — Care Plan (Signed)
Notified Dr. Bedelia Person about pt dysrhythmia and low BP. Gave 250cc NS fluid bolus and requested Levo orders in the event that BP dropped below acceptable parameters. Orders received, will continue to monitor pt.

## 2020-07-28 ENCOUNTER — Inpatient Hospital Stay (HOSPITAL_COMMUNITY): Payer: Self-pay

## 2020-07-28 LAB — COMPREHENSIVE METABOLIC PANEL
ALT: 108 U/L — ABNORMAL HIGH (ref 0–44)
AST: 157 U/L — ABNORMAL HIGH (ref 15–41)
Albumin: 1.9 g/dL — ABNORMAL LOW (ref 3.5–5.0)
Alkaline Phosphatase: 112 U/L (ref 38–126)
Anion gap: 10 (ref 5–15)
BUN: 37 mg/dL — ABNORMAL HIGH (ref 6–20)
CO2: 21 mmol/L — ABNORMAL LOW (ref 22–32)
Calcium: 7.9 mg/dL — ABNORMAL LOW (ref 8.9–10.3)
Chloride: 122 mmol/L — ABNORMAL HIGH (ref 98–111)
Creatinine, Ser: 0.7 mg/dL (ref 0.44–1.00)
GFR, Estimated: >60 mL/min (ref >60)
Glucose, Bld: 244 mg/dL — ABNORMAL HIGH (ref 70–99)
Potassium: 4 mmol/L (ref 3.5–5.1)
Sodium: 153 mmol/L — ABNORMAL HIGH (ref 135–145)
Total Bilirubin: 6 mg/dL — ABNORMAL HIGH (ref 0.3–1.2)
Total Protein: 5.3 g/dL — ABNORMAL LOW (ref 6.5–8.1)

## 2020-07-28 LAB — HEMOGLOBIN A1C
Hgb A1c MFr Bld: 5.6 % (ref 4.8–5.6)
Mean Plasma Glucose: 114 mg/dL

## 2020-07-28 LAB — CULTURE, BLOOD (ROUTINE X 2)

## 2020-07-28 LAB — GLUCOSE, CAPILLARY
Glucose-Capillary: 203 mg/dL — ABNORMAL HIGH (ref 70–99)
Glucose-Capillary: 211 mg/dL — ABNORMAL HIGH (ref 70–99)
Glucose-Capillary: 168 mg/dL — ABNORMAL HIGH (ref 70–99)
Glucose-Capillary: 165 mg/dL — ABNORMAL HIGH (ref 70–99)
Glucose-Capillary: 153 mg/dL — ABNORMAL HIGH (ref 70–99)
Glucose-Capillary: 117 mg/dL — ABNORMAL HIGH (ref 70–99)

## 2020-07-28 LAB — CBC
HCT: 33.4 % — ABNORMAL LOW (ref 36.0–46.0)
Hemoglobin: 10.3 g/dL — ABNORMAL LOW (ref 12.0–15.0)
MCH: 32.8 pg (ref 26.0–34.0)
MCHC: 30.8 g/dL (ref 30.0–36.0)
MCV: 106.4 fL — ABNORMAL HIGH (ref 80.0–100.0)
Platelets: 482 10*3/uL — ABNORMAL HIGH (ref 150–400)
RBC: 3.14 MIL/uL — ABNORMAL LOW (ref 3.87–5.11)
RDW: 22.2 % — ABNORMAL HIGH (ref 11.5–15.5)
WBC: 35.3 10*3/uL — ABNORMAL HIGH (ref 4.0–10.5)
nRBC: 2.1 % — ABNORMAL HIGH (ref 0.0–0.2)

## 2020-07-28 LAB — AMMONIA: Ammonia: 50 umol/L — ABNORMAL HIGH (ref 9–35)

## 2020-07-28 MED ORDER — FREE WATER
200.0000 mL | Freq: Four times a day (QID) | Status: DC
  Administered 2020-07-28 – 2020-08-01 (×16): 200 mL

## 2020-07-28 MED ORDER — VANCOMYCIN HCL 1.5 G IV SOLR
1500.0000 mg | Freq: Once | INTRAVENOUS | Status: AC
  Administered 2020-07-28: 1500 mg via INTRAVENOUS
  Filled 2020-07-28 (×2): qty 1500

## 2020-07-28 MED ORDER — VITAL AF 1.2 CAL PO LIQD
1000.0000 mL | ORAL | Status: DC
  Administered 2020-07-28 – 2020-07-29 (×2): 1000 mL
  Filled 2020-07-28: qty 1000

## 2020-07-28 MED ORDER — PROSOURCE TF PO LIQD
45.0000 mL | Freq: Two times a day (BID) | ORAL | Status: DC
  Administered 2020-07-28 – 2020-08-01 (×8): 45 mL
  Filled 2020-07-28 (×8): qty 45

## 2020-07-28 MED ORDER — SODIUM CHLORIDE 0.9 % IV SOLN
1.0000 g | Freq: Three times a day (TID) | INTRAVENOUS | Status: DC
  Administered 2020-07-28 – 2020-08-02 (×15): 1 g via INTRAVENOUS
  Filled 2020-07-28 (×8): qty 1
  Filled 2020-07-28: qty 3
  Filled 2020-07-28 (×3): qty 1
  Filled 2020-07-28: qty 3
  Filled 2020-07-28 (×5): qty 1

## 2020-07-28 MED ORDER — VANCOMYCIN HCL IN DEXTROSE 750-5 MG/150ML-% IV SOLN
750.0000 mg | Freq: Three times a day (TID) | INTRAVENOUS | Status: DC
  Administered 2020-07-28 – 2020-07-30 (×5): 750 mg via INTRAVENOUS
  Filled 2020-07-28 (×6): qty 150
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
## 2020-07-28 NOTE — Plan of Care (Signed)
  Problem: Clinical Measurements: Goal: Respiratory complications will improve Outcome: Progressing   Problem: Nutrition: Goal: Adequate nutrition will be maintained Outcome: Progressing   Problem: Elimination: Goal: Will not experience complications related to bowel motility Outcome: Progressing   Problem: Nutrition: Goal: Adequate nutrition will be maintained Outcome: Progressing   Problem: Safety: Goal: Ability to remain free from injury will improve Outcome: Progressing   Problem: Activity: Goal: Will remain free from falls Outcome: Progressing   Problem: Respiratory: Goal: Ability to maintain adequate ventilation will improve Outcome: Progressing   Problem: Tissue Perfusion: Goal: Postoperative complications will be avoided or minimized Outcome: Progressing
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
## 2020-07-28 NOTE — Progress Notes (Addendum)
Additonal non face-to-face encounters:   Palliative note-   1243: I spoke with patient's mother Betsy Coder. She agrees to be a Management consultant for patient.   Therefore, now, Betsy Coder and Laveda Abbe are together equal primary decision makers.   I again attempted to call Laveda Abbe to follow-up and discuss plan for decision makers and answer his questions. No answer and no voicemail.   1553- I was able to reach [facility] via phone. Informed him that he and patient's mother together will now be decision makers. When it is time for a decision making meeting, then [name] and [name] will be requested to meet with providers alone and face to face.  [name] requested that he be the only Management consultant for patient and [name] not be included. Let [name] know that we will continue to follow [place] surrogate decision hierarchy based on what information we have- this means that he and [name] will be decision makers together. If he wishes to be only decision maker, then he will need to petition a court for legal guardianship. [name] stated that he plans on pursuing court petition.  Additionally, ensured that [facility] had [employer] and password, and that he had the unit phone number so that he could easily access medical information regarding patient. [name] requested that I email this information and I informed him I could not email him patient's information.  Of note- I asked patient if his father Edelyn Heidel was present during our discussion and patient stated he was not. Additionally, it was requested that the MyChart username and password not be shared with patient's ex-husband.   During the call, the pacing of [name]'s statements and questions felt as though he was being coached and were sometimes not relevant to the subject that we were discussing. It is unclear if these questions and statements were being originated by [facility] or someone in the background prompting him.   Ocie Bob,  AGNP-C Palliative Medicine  Total additional non face to face time: 32 minutes

## 2020-07-28 NOTE — Progress Notes (Addendum)
Palliative note regarding legal decision maker for patient:   At the time of patient's admission patient's ex-spouse Diana Foster was contacted as he was primary contact. It was determined then that he was not decision maker.  Diana Foster expressed to case manager that there was an adult child- Diana Foster- who is 53, however, Diana Foster stated that Diana Foster could not serve as Management consultant due to developmental issues. Diana Foster gave case manager contact information for Diana Batt's- patient's sister.   After Diana was contacted it was also determined that patient had a living parent, Diana Foster. Diana Foster declined decision making involvement and deferred to Diana Foster.   All above is documented several times throughout patient's chart.  On October 20th, providers received notice that patient's son, Diana Foster would like be included in decision making. Therefore, patient's son was contacted, and meeting was arranged with all family members for goals of care discussion and held on October 21st.    Notified today, October 22, by [employer] director that letter was received via e-mail from patient's son requesting to be the sole decision maker for patient.   I have also received notice from ethics committee member Diana Creed, NP that patient's mother is now willing to serve as a Management consultant.   According to [facility] General Statute regarding surrogate decision makers patient's living parents and living adult children are together considered decision makers- one does not overide another within the hierarchy.   I am currently making attempts to get contact information for patient's mother- Diana Foster to confirm her wishes.   I have also attempted to call Diana Foster, however, there was no answer, and no voicemail to leave a message. Per discussion with ethics and Palliative team- all discussion with Diana Foster needs to be in person or on the phone. I will continue to try and reach [facility] in hopes that he will be  available.   Any discussions involving actual decision making need to be in person.   Diana Foster, AGNP-C Palliative Medicine  Please call Palliative Medicine team phone with any questions [phone]. For individual providers please see AMION.
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
## 2020-07-29 ENCOUNTER — Inpatient Hospital Stay (HOSPITAL_COMMUNITY): Payer: Self-pay

## 2020-07-29 LAB — CBC
HCT: 32 % — ABNORMAL LOW (ref 36.0–46.0)
Hemoglobin: 9.6 g/dL — ABNORMAL LOW (ref 12.0–15.0)
MCH: 32.3 pg (ref 26.0–34.0)
MCHC: 30 g/dL (ref 30.0–36.0)
MCV: 107.7 fL — ABNORMAL HIGH (ref 80.0–100.0)
Platelets: 520 10*3/uL — ABNORMAL HIGH (ref 150–400)
RBC: 2.97 MIL/uL — ABNORMAL LOW (ref 3.87–5.11)
RDW: 21.6 % — ABNORMAL HIGH (ref 11.5–15.5)
WBC: 31.5 10*3/uL — ABNORMAL HIGH (ref 4.0–10.5)
nRBC: 2.4 % — ABNORMAL HIGH (ref 0.0–0.2)

## 2020-07-29 LAB — COMPREHENSIVE METABOLIC PANEL
ALT: 131 U/L — ABNORMAL HIGH (ref 0–44)
AST: 162 U/L — ABNORMAL HIGH (ref 15–41)
Albumin: 1.5 g/dL — ABNORMAL LOW (ref 3.5–5.0)
Alkaline Phosphatase: 75 U/L (ref 38–126)
Anion gap: 8 (ref 5–15)
BUN: 54 mg/dL — ABNORMAL HIGH (ref 6–20)
CO2: 22 mmol/L (ref 22–32)
Calcium: 7.7 mg/dL — ABNORMAL LOW (ref 8.9–10.3)
Chloride: 121 mmol/L — ABNORMAL HIGH (ref 98–111)
Creatinine, Ser: 0.78 mg/dL (ref 0.44–1.00)
GFR, Estimated: >60 mL/min (ref >60)
Glucose, Bld: 222 mg/dL — ABNORMAL HIGH (ref 70–99)
Potassium: 3.7 mmol/L (ref 3.5–5.1)
Sodium: 151 mmol/L — ABNORMAL HIGH (ref 135–145)
Total Bilirubin: 5.2 mg/dL — ABNORMAL HIGH (ref 0.3–1.2)
Total Protein: 4.9 g/dL — ABNORMAL LOW (ref 6.5–8.1)

## 2020-07-29 LAB — GLUCOSE, CAPILLARY
Glucose-Capillary: 154 mg/dL — ABNORMAL HIGH (ref 70–99)
Glucose-Capillary: 126 mg/dL — ABNORMAL HIGH (ref 70–99)
Glucose-Capillary: 152 mg/dL — ABNORMAL HIGH (ref 70–99)
Glucose-Capillary: 137 mg/dL — ABNORMAL HIGH (ref 70–99)
Glucose-Capillary: 122 mg/dL — ABNORMAL HIGH (ref 70–99)
Glucose-Capillary: 121 mg/dL — ABNORMAL HIGH (ref 70–99)

## 2020-07-29 LAB — CULTURE, BLOOD (ROUTINE X 2): Culture: NO GROWTH

## 2020-07-29 LAB — CULTURE, RESPIRATORY W GRAM STAIN: Culture: NORMAL

## 2020-07-29 LAB — AMMONIA: Ammonia: 37 umol/L — ABNORMAL HIGH (ref 9–35)

## 2020-07-29 NOTE — Care Plan (Signed)
Spoke to Dr. Derrell Lolling about pt vomiting up tube feeding. MD instructed to stop tube feeding until AM for further evaluation. Will continue to monitor pt.
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
## 2020-07-29 NOTE — Progress Notes (Signed)
Orthopedic Tech Progress Note Patient Details:  Diana Foster 1971/03/04 [id number]  Ortho Devices Type of Ortho Device: Prafo boot/shoe Ortho Device/Splint Location: Left Lower Extremity Ortho Device/Splint Interventions: Ordered, Application   Post Interventions Patient Tolerated: Well Instructions Provided: Adjustment of device, Care of device   Melyna Huron P Harle Stanford 07/29/2020, 3:18 PM

## 2020-07-30 LAB — CBC WITH DIFFERENTIAL/PLATELET
Abs Immature Granulocytes: 0.88 10*3/uL — ABNORMAL HIGH (ref 0.00–0.07)
Basophils Absolute: 0.1 10*3/uL (ref 0.0–0.1)
Basophils Relative: 0 %
Eosinophils Absolute: 0.2 10*3/uL (ref 0.0–0.5)
Eosinophils Relative: 1 %
HCT: 31.7 % — ABNORMAL LOW (ref 36.0–46.0)
Hemoglobin: 9.5 g/dL — ABNORMAL LOW (ref 12.0–15.0)
Immature Granulocytes: 3 %
Lymphocytes Relative: 5 %
Lymphs Abs: 1.5 10*3/uL (ref 0.7–4.0)
MCH: 32 pg (ref 26.0–34.0)
MCHC: 30 g/dL (ref 30.0–36.0)
MCV: 106.7 fL — ABNORMAL HIGH (ref 80.0–100.0)
Monocytes Absolute: 2.5 10*3/uL — ABNORMAL HIGH (ref 0.1–1.0)
Monocytes Relative: 8 %
Neutro Abs: 25.6 10*3/uL — ABNORMAL HIGH (ref 1.7–7.7)
Neutrophils Relative %: 83 %
Platelets: 536 10*3/uL — ABNORMAL HIGH (ref 150–400)
RBC: 2.97 MIL/uL — ABNORMAL LOW (ref 3.87–5.11)
RDW: 21.9 % — ABNORMAL HIGH (ref 11.5–15.5)
WBC: 30.7 10*3/uL — ABNORMAL HIGH (ref 4.0–10.5)
nRBC: 6.6 % — ABNORMAL HIGH (ref 0.0–0.2)

## 2020-07-30 LAB — GLUCOSE, CAPILLARY
Glucose-Capillary: 113 mg/dL — ABNORMAL HIGH (ref 70–99)
Glucose-Capillary: 125 mg/dL — ABNORMAL HIGH (ref 70–99)
Glucose-Capillary: 130 mg/dL — ABNORMAL HIGH (ref 70–99)
Glucose-Capillary: 130 mg/dL — ABNORMAL HIGH (ref 70–99)
Glucose-Capillary: 145 mg/dL — ABNORMAL HIGH (ref 70–99)
Glucose-Capillary: 150 mg/dL — ABNORMAL HIGH (ref 70–99)

## 2020-07-30 LAB — BASIC METABOLIC PANEL
Anion gap: 10 (ref 5–15)
BUN: 75 mg/dL — ABNORMAL HIGH (ref 6–20)
CO2: 19 mmol/L — ABNORMAL LOW (ref 22–32)
Calcium: 7.9 mg/dL — ABNORMAL LOW (ref 8.9–10.3)
Chloride: 120 mmol/L — ABNORMAL HIGH (ref 98–111)
Creatinine, Ser: 1.2 mg/dL — ABNORMAL HIGH (ref 0.44–1.00)
GFR, Estimated: 56 mL/min — ABNORMAL LOW (ref >60)
Glucose, Bld: 198 mg/dL — ABNORMAL HIGH (ref 70–99)
Potassium: 3.6 mmol/L (ref 3.5–5.1)
Sodium: 149 mmol/L — ABNORMAL HIGH (ref 135–145)

## 2020-07-30 LAB — PREALBUMIN: Prealbumin: 9.3 mg/dL — ABNORMAL LOW (ref 18–38)

## 2020-07-30 LAB — VANCOMYCIN, TROUGH: Vancomycin Tr: 47 ug/mL (ref 15–20)

## 2020-07-30 LAB — AMMONIA: Ammonia: 37 umol/L — ABNORMAL HIGH (ref 9–35)

## 2020-07-30 MED ORDER — PNEUMOCOCCAL 13-VAL CONJ VACC IM SUSP
0.5000 mL | Freq: Once | INTRAMUSCULAR | Status: AC
  Administered 2020-07-30: 0.5 mL via INTRAMUSCULAR
  Filled 2020-07-30: qty 0.5

## 2020-07-30 MED ORDER — HAEMOPHILUS B POLYSAC CONJ VAC IM SOLR
0.5000 mL | Freq: Once | INTRAMUSCULAR | Status: AC
  Administered 2020-07-30: 0.5 mL via INTRAMUSCULAR
  Filled 2020-07-30: qty 0.5

## 2020-07-30 MED ORDER — MENINGOCOCCAL A C Y&W-135 OLIG IM SOLR
0.5000 mL | Freq: Once | INTRAMUSCULAR | Status: AC
  Administered 2020-07-30: 0.5 mL via INTRAMUSCULAR
  Filled 2020-07-30 (×2): qty 0.5

## 2020-07-30 MED ORDER — VITAL AF 1.2 CAL PO LIQD
1000.0000 mL | ORAL | Status: DC
  Administered 2020-07-31 – 2020-08-01 (×2): 1000 mL
  Filled 2020-07-30 (×3): qty 1000

## 2020-07-30 MED ORDER — VANCOMYCIN VARIABLE DOSE PER UNSTABLE RENAL FUNCTION (PHARMACIST DOSING): Status: DC
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
## 2020-07-30 NOTE — Plan of Care (Signed)
  Problem: Clinical Measurements: Goal: Ability to maintain clinical measurements within normal limits will improve Outcome: Progressing Goal: Will remain free from infection Outcome: Progressing Goal: Cardiovascular complication will be avoided Outcome: Progressing   Problem: Nutrition: Goal: Adequate nutrition will be maintained Outcome: Progressing   Problem: Pain Managment: Goal: General experience of comfort will improve Outcome: Progressing   Problem: Skin Integrity: Goal: Risk for impaired skin integrity will decrease Outcome: Progressing   Problem: Nutrition: Goal: Adequate nutrition will be maintained Outcome: Progressing   Problem: Safety: Goal: Ability to remain free from injury will improve Outcome: Progressing   Problem: Skin Integrity: Goal: Risk for impaired skin integrity will decrease Outcome: Progressing   Problem: Activity: Goal: Will remain free from falls Outcome: Progressing   Problem: Tissue Perfusion: Goal: Hemodynamically stable with effective tissue perfusion will improve Outcome: Progressing

## 2020-07-31 ENCOUNTER — Inpatient Hospital Stay (HOSPITAL_COMMUNITY): Payer: Self-pay

## 2020-07-31 LAB — GLUCOSE, CAPILLARY
Glucose-Capillary: 147 mg/dL — ABNORMAL HIGH (ref 70–99)
Glucose-Capillary: 135 mg/dL — ABNORMAL HIGH (ref 70–99)
Glucose-Capillary: 152 mg/dL — ABNORMAL HIGH (ref 70–99)
Glucose-Capillary: 161 mg/dL — ABNORMAL HIGH (ref 70–99)
Glucose-Capillary: 153 mg/dL — ABNORMAL HIGH (ref 70–99)
Glucose-Capillary: 137 mg/dL — ABNORMAL HIGH (ref 70–99)

## 2020-07-31 LAB — BASIC METABOLIC PANEL
Anion gap: 11 (ref 5–15)
BUN: 97 mg/dL — ABNORMAL HIGH (ref 6–20)
CO2: 19 mmol/L — ABNORMAL LOW (ref 22–32)
Calcium: 8.1 mg/dL — ABNORMAL LOW (ref 8.9–10.3)
Chloride: 123 mmol/L — ABNORMAL HIGH (ref 98–111)
Creatinine, Ser: 1.26 mg/dL — ABNORMAL HIGH (ref 0.44–1.00)
GFR, Estimated: 53 mL/min — ABNORMAL LOW (ref >60)
Glucose, Bld: 148 mg/dL — ABNORMAL HIGH (ref 70–99)
Potassium: 3.4 mmol/L — ABNORMAL LOW (ref 3.5–5.1)
Sodium: 153 mmol/L — ABNORMAL HIGH (ref 135–145)

## 2020-07-31 LAB — AMMONIA: Ammonia: 41 umol/L — ABNORMAL HIGH (ref 9–35)

## 2020-07-31 LAB — VANCOMYCIN, RANDOM: Vancomycin Rm: 27

## 2020-07-31 MED ORDER — THIAMINE HCL 100 MG PO TABS
100.0000 mg | ORAL_TABLET | Freq: Every day | ORAL | Status: DC
  Administered 2020-08-01: 100 mg
  Filled 2020-07-31: qty 1

## 2020-07-31 MED ORDER — PANTOPRAZOLE SODIUM 40 MG PO PACK
40.0000 mg | PACK | Freq: Every day | ORAL | Status: DC
  Administered 2020-08-01: 40 mg
  Filled 2020-07-31: qty 20

## 2020-07-31 MED ORDER — POTASSIUM CHLORIDE 20 MEQ/15ML (10%) PO SOLN
40.0000 meq | ORAL | Status: AC
  Administered 2020-07-31 (×2): 40 meq via ORAL
  Filled 2020-07-31 (×2): qty 30

## 2020-07-31 MED ORDER — ALBUMIN HUMAN 5 % IV SOLN
25.0000 g | Freq: Once | INTRAVENOUS | Status: AC
  Administered 2020-07-31: 25 g via INTRAVENOUS
  Filled 2020-07-31: qty 500

## 2020-07-31 MED ORDER — LACTULOSE 10 GM/15ML PO SOLN
20.0000 g | Freq: Two times a day (BID) | ORAL | Status: DC
  Administered 2020-07-31 – 2020-08-01 (×2): 20 g
  Filled 2020-07-31 (×2): qty 30

## 2020-07-31 MED ORDER — ALBUMIN HUMAN 5 % IV SOLN: 25.0000 g | Freq: Once | INTRAVENOUS | Status: DC

## 2020-07-31 MED ORDER — VANCOMYCIN HCL IN DEXTROSE 750-5 MG/150ML-% IV SOLN
750.0000 mg | Freq: Two times a day (BID) | INTRAVENOUS | Status: DC
  Administered 2020-07-31 – 2020-08-02 (×4): 750 mg via INTRAVENOUS
  Filled 2020-07-31 (×5): qty 150

## 2020-07-31 MED ORDER — DEXMEDETOMIDINE HCL IN NACL 400 MCG/100ML IV SOLN
0.4000 ug/kg/h | INTRAVENOUS | Status: DC
  Administered 2020-07-31 (×2): 0.4 ug/kg/h via INTRAVENOUS
  Filled 2020-07-31 (×2): qty 100

## 2020-07-31 NOTE — Care Plan (Signed)
Pt's son called unit to request that he be notified of time of palliative care meeting with family in AM. Son states that he has not received any communication from any family regarding the meeting schedule.

## 2020-07-31 NOTE — Progress Notes (Signed)
Cortrak Tube Team Note:  Consult received to replace a Cortrak feeding tube.   Cortrak feeding tube noted at 42cm upon RD arrival. Tube advanced from esophagus into stomach and secured at 69cm.   No x-ray is required. RN may begin using tube.   If the tube becomes dislodged please keep the tube and contact the Cortrak team at [email or web address] (password [email or web address]) for replacement.  If after hours and replacement cannot be delayed, place a NG tube and confirm placement with an abdominal x-ray.    Betsey Holiday MS, RD, LDN Please refer to [facility] for RD and/or RD on-call/weekend/after hours pager
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
## 2020-07-31 NOTE — Plan of Care (Signed)
  Problem: Nutrition: Goal: Adequate nutrition will be maintained Outcome: Progressing   Problem: Elimination: Goal: Will not experience complications related to bowel motility Outcome: Progressing   Problem: Pain Managment: Goal: General experience of comfort will improve Outcome: Progressing   Problem: Nutrition: Goal: Adequate nutrition will be maintained Outcome: Progressing   Problem: Coping: Goal: Level of anxiety will decrease Outcome: Progressing   Problem: Elimination: Goal: Will not experience complications related to bowel motility Outcome: Progressing   Problem: Pain Managment: Goal: General experience of comfort will improve Outcome: Progressing   Problem: Safety: Goal: Ability to remain free from injury will improve Outcome: Progressing   Problem: Activity: Goal: Will remain free from falls Outcome: Progressing   Problem: Respiratory: Goal: Ability to maintain adequate ventilation will improve Outcome: Progressing   Problem: Tissue Perfusion: Goal: Postoperative complications will be avoided or minimized Outcome: Progressing   Problem: Infection: Goal: Risk for infection will decrease (spleen) Outcome: Progressing

## 2020-08-01 ENCOUNTER — Inpatient Hospital Stay (HOSPITAL_COMMUNITY): Payer: Self-pay

## 2020-08-01 DIAGNOSIS — L8990 Pressure ulcer of unspecified site, unspecified stage: Secondary | ICD-10-CM | POA: Insufficient documentation

## 2020-08-01 LAB — BASIC METABOLIC PANEL
Anion gap: 7 (ref 5–15)
BUN: 93 mg/dL — ABNORMAL HIGH (ref 6–20)
CO2: 20 mmol/L — ABNORMAL LOW (ref 22–32)
Calcium: 7.7 mg/dL — ABNORMAL LOW (ref 8.9–10.3)
Chloride: 126 mmol/L — ABNORMAL HIGH (ref 98–111)
Creatinine, Ser: 0.97 mg/dL (ref 0.44–1.00)
GFR, Estimated: >60 mL/min (ref >60)
Glucose, Bld: 188 mg/dL — ABNORMAL HIGH (ref 70–99)
Potassium: 3.6 mmol/L (ref 3.5–5.1)
Sodium: 153 mmol/L — ABNORMAL HIGH (ref 135–145)

## 2020-08-01 LAB — CBC
HCT: 31.7 % — ABNORMAL LOW (ref 36.0–46.0)
Hemoglobin: 9.2 g/dL — ABNORMAL LOW (ref 12.0–15.0)
MCH: 32.2 pg (ref 26.0–34.0)
MCHC: 29 g/dL — ABNORMAL LOW (ref 30.0–36.0)
MCV: 110.8 fL — ABNORMAL HIGH (ref 80.0–100.0)
Platelets: 516 10*3/uL — ABNORMAL HIGH (ref 150–400)
RBC: 2.86 MIL/uL — ABNORMAL LOW (ref 3.87–5.11)
RDW: 20.7 % — ABNORMAL HIGH (ref 11.5–15.5)
WBC: 29.4 10*3/uL — ABNORMAL HIGH (ref 4.0–10.5)
nRBC: 5 % — ABNORMAL HIGH (ref 0.0–0.2)

## 2020-08-01 LAB — AMMONIA: Ammonia: 60 umol/L — ABNORMAL HIGH (ref 9–35)

## 2020-08-01 LAB — GLUCOSE, CAPILLARY
Glucose-Capillary: 163 mg/dL — ABNORMAL HIGH (ref 70–99)
Glucose-Capillary: 151 mg/dL — ABNORMAL HIGH (ref 70–99)
Glucose-Capillary: 152 mg/dL — ABNORMAL HIGH (ref 70–99)
Glucose-Capillary: 134 mg/dL — ABNORMAL HIGH (ref 70–99)
Glucose-Capillary: 148 mg/dL — ABNORMAL HIGH (ref 70–99)

## 2020-08-01 LAB — PATHOLOGIST SMEAR REVIEW

## 2020-08-01 MED ORDER — GLYCOPYRROLATE 1 MG PO TABS
1.0000 mg | ORAL_TABLET | ORAL | Status: DC | PRN
  Filled 2020-08-01: qty 1

## 2020-08-01 MED ORDER — LORAZEPAM 1 MG PO TABS: 1.0000 mg | ORAL_TABLET | ORAL | Status: DC | PRN

## 2020-08-01 MED ORDER — HALOPERIDOL 0.5 MG PO TABS
0.5000 mg | ORAL_TABLET | ORAL | Status: DC | PRN
  Filled 2020-08-01: qty 1

## 2020-08-01 MED ORDER — VITAL AF 1.2 CAL PO LIQD: 1000.0000 mL | ORAL | Status: DC

## 2020-08-01 MED ORDER — GLYCOPYRROLATE 0.2 MG/ML IJ SOLN
0.2000 mg | INTRAMUSCULAR | Status: DC | PRN
  Administered 2020-08-02: 0.2 mg via INTRAVENOUS
  Filled 2020-08-01: qty 1

## 2020-08-01 MED ORDER — HALOPERIDOL LACTATE 2 MG/ML PO CONC
0.5000 mg | ORAL | Status: DC | PRN
  Filled 2020-08-01: qty 0.3

## 2020-08-01 MED ORDER — HYDROMORPHONE HCL 1 MG/ML IJ SOLN: 1.0000 mg | INTRAMUSCULAR | Status: DC | PRN

## 2020-08-01 MED ORDER — LORAZEPAM 2 MG/ML IJ SOLN: 1.0000 mg | INTRAMUSCULAR | Status: DC | PRN

## 2020-08-01 MED ORDER — ONDANSETRON HCL 4 MG/2ML IJ SOLN: 4.0000 mg | Freq: Four times a day (QID) | INTRAMUSCULAR | Status: DC | PRN

## 2020-08-01 MED ORDER — ONDANSETRON 4 MG PO TBDP: 4.0000 mg | ORAL_TABLET | Freq: Four times a day (QID) | ORAL | Status: DC | PRN

## 2020-08-01 MED ORDER — GLYCOPYRROLATE 0.2 MG/ML IJ SOLN: 0.2000 mg | INTRAMUSCULAR | Status: DC | PRN

## 2020-08-01 MED ORDER — BIOTENE DRY MOUTH MT LIQD: 15.0000 mL | OROMUCOSAL | Status: DC | PRN

## 2020-08-01 MED ORDER — INFLUENZA VAC SPLIT QUAD 0.5 ML IM SUSY: 0.5000 mL | PREFILLED_SYRINGE | INTRAMUSCULAR | Status: DC

## 2020-08-01 MED ORDER — HALOPERIDOL LACTATE 5 MG/ML IJ SOLN: 0.5000 mg | INTRAMUSCULAR | Status: DC | PRN

## 2020-08-01 MED ORDER — LORAZEPAM 2 MG/ML PO CONC: 1.0000 mg | ORAL | Status: DC | PRN

## 2020-08-01 MED ORDER — POLYVINYL ALCOHOL 1.4 % OP SOLN
1.0000 [drp] | Freq: Four times a day (QID) | OPHTHALMIC | Status: DC | PRN
  Filled 2020-08-01: qty 15
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
## 2020-08-01 NOTE — Progress Notes (Signed)
[facility] contacted at request from [employer].  Pt was already referred on 10/18.  Referral # [id number].  Updated [facility] that there were plans in place to withdrawal care on this pt tomorrow.

## 2020-08-01 NOTE — Progress Notes (Signed)
Palliative Medicine RN Note: Our team has rec'd calls from son [name] and mother [name] that they agree with comfort care for Diana Foster. Our PA Clerance Lav will put in orders for today with a plan to extubate tomorrow.  Family friend [name] was on the phone when I spoke with [name]; she asked about visiting by pt's minor children aged 47 and 21. I told her that decision is up to the nursing leadership on the unit and referred her to [name]'s nurse for today.  Dr Bedelia Person is aware of plan.  Margret Chance Rosaleigh Brazzel, RN, BSN, [facility] Palliative Medicine Team 08/01/2020 3:02 PM Office [phone]
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
## 2020-08-02 LAB — GLUCOSE, CAPILLARY
Glucose-Capillary: 131 mg/dL — ABNORMAL HIGH (ref 70–99)
Glucose-Capillary: 146 mg/dL — ABNORMAL HIGH (ref 70–99)
Glucose-Capillary: 158 mg/dL — ABNORMAL HIGH (ref 70–99)

## 2020-08-02 MED ORDER — HYDROMORPHONE HCL 1 MG/ML IJ SOLN: 1.0000 mg | INTRAMUSCULAR | Status: DC | PRN

## 2020-08-02 MED ORDER — HYPROMELLOSE (GONIOSCOPIC) 2.5 % OP SOLN
1.0000 [drp] | OPHTHALMIC | Status: DC | PRN
  Filled 2020-08-02: qty 15

## 2020-08-02 MED ORDER — LORAZEPAM 2 MG/ML IJ SOLN
2.0000 mg | INTRAMUSCULAR | Status: DC | PRN
  Administered 2020-08-02: 2 mg via INTRAVENOUS
  Filled 2020-08-02: qty 1

## 2020-08-02 MED ORDER — DEXAMETHASONE SODIUM PHOSPHATE 4 MG/ML IJ SOLN: 4.0000 mg | Freq: Four times a day (QID) | INTRAMUSCULAR | Status: DC | PRN

## 2020-08-02 MED ORDER — MORPHINE BOLUS VIA INFUSION
4.0000 mg | INTRAVENOUS | Status: DC | PRN
  Filled 2020-08-02: qty 4

## 2020-08-02 MED ORDER — MORPHINE 100MG IN NS 100ML (1MG/ML) PREMIX INFUSION
2.0000 mg/h | INTRAVENOUS | Status: DC
  Administered 2020-08-02: 2 mg/h via INTRAVENOUS
  Filled 2020-08-02: qty 100

## 2020-08-02 MED ORDER — BISACODYL 10 MG RE SUPP: 10.0000 mg | Freq: Every day | RECTAL | Status: DC | PRN

## 2020-08-03 LAB — CULTURE, RESPIRATORY W GRAM STAIN: Culture: NORMAL

## 2020-08-07 NOTE — Progress Notes (Signed)
This chaplain responded to PMT consult to build rapport with Pt. family as they experience EOL with the Pt.  The chaplain checked in with the Unit Sec and the Pt. RN-Katy.  The chaplain understands the Pt family may visit three at a time with the exception of the Pt. ex-husband.  The chaplain introduced herself to the Pt. twin sister-April and other family members in the 4N waiting area.  The chaplain informed the RN family is present.  The chaplain listened as April shared stories of the Pt. and their uniquely, special relationship as twins.  The chaplain experienced peace from the group in the story telling and honored their grieving process today and tomorrow. The chaplain offered to be present as the family requested.

## 2020-08-07 NOTE — Progress Notes (Signed)
Pt terminally extubated per order.
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
## 2020-08-07 NOTE — Progress Notes (Signed)
Spoke with Beverely Pace with [facility] about extubation.  Will call with CTOD for possible tissue and eye donation due to pt being first person consent.  Ref # [id number]  [facility] # [phone]
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
## 2020-08-07 NOTE — Progress Notes (Signed)
Cardiac time of Death: 1758.  Pronounced by Londell Moh, RN and Gabriel Carina, RN.  Notified Financial planner.    Pt is ME case.

## 2020-08-07 NOTE — Progress Notes (Signed)
This chaplain was present with family for F/U spiritual care as needed.

## 2020-08-07 NOTE — Progress Notes (Signed)
Notified [name], pt's son of pt passing.
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
## 2020-08-07 NOTE — Progress Notes (Signed)
Morphine gtt initiated, ativan and robinul given.  Extubated pt at 1650.  Pt's sister, BF, brother and nieces at bedside.  RN spoke with pt's son, [name] on the phone to notify him of extubation.  Pt seemingly comfortable at this time.

## 2020-08-07 DEATH — deceased

## 2022-08-20 IMAGING — DX DG CHEST 1V PORT
1 series · 1 of 1 positions shown · non-contrast
Comparison: 07/21/2020

CLINICAL DATA: Hypoxia

EXAM:
PORTABLE CHEST 1 VIEW

[chest ap]
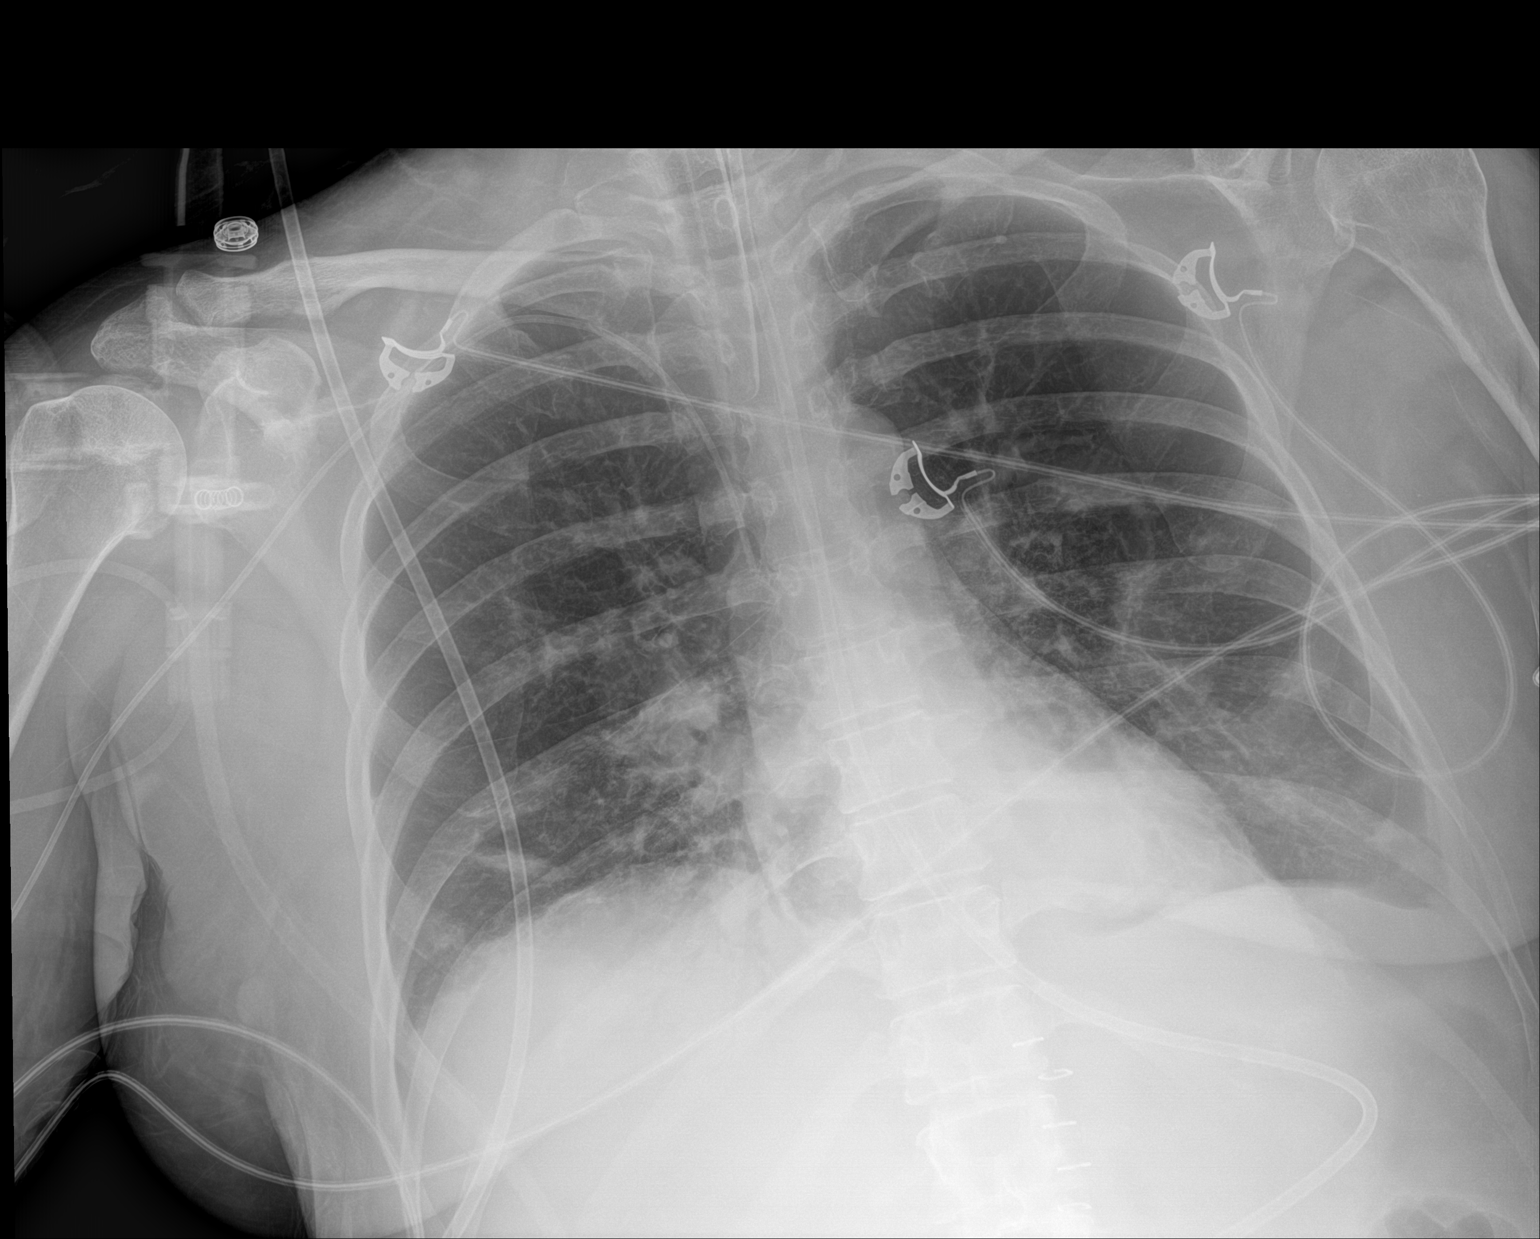

[1 of 1 positions shown; findings below may reference images not displayed]

FINDINGS: No significant interval change in AP portable chest radiograph with
support apparatus including endotracheal tube, partially imaged
enteric feeding tube, and right upper extremity PICC. Small layering
pleural effusions. The heart and mediastinum are unremarkable.
IMPRESSION: 1. No significant interval change in AP portable chest radiograph
with support apparatus including endotracheal tube, partially imaged
enteric feeding tube, and right upper extremity PICC.

2.  Small layering pleural effusions.

## 2022-08-28 IMAGING — DX DG ABDOMEN 1V
1 series · 1 of 1 positions shown · non-contrast
Comparison: Portable exam 4340 hours compared to 07/23/2020

CLINICAL DATA: Feeding tube placement

EXAM:
ABDOMEN - 1 VIEW

[abdomen kub]
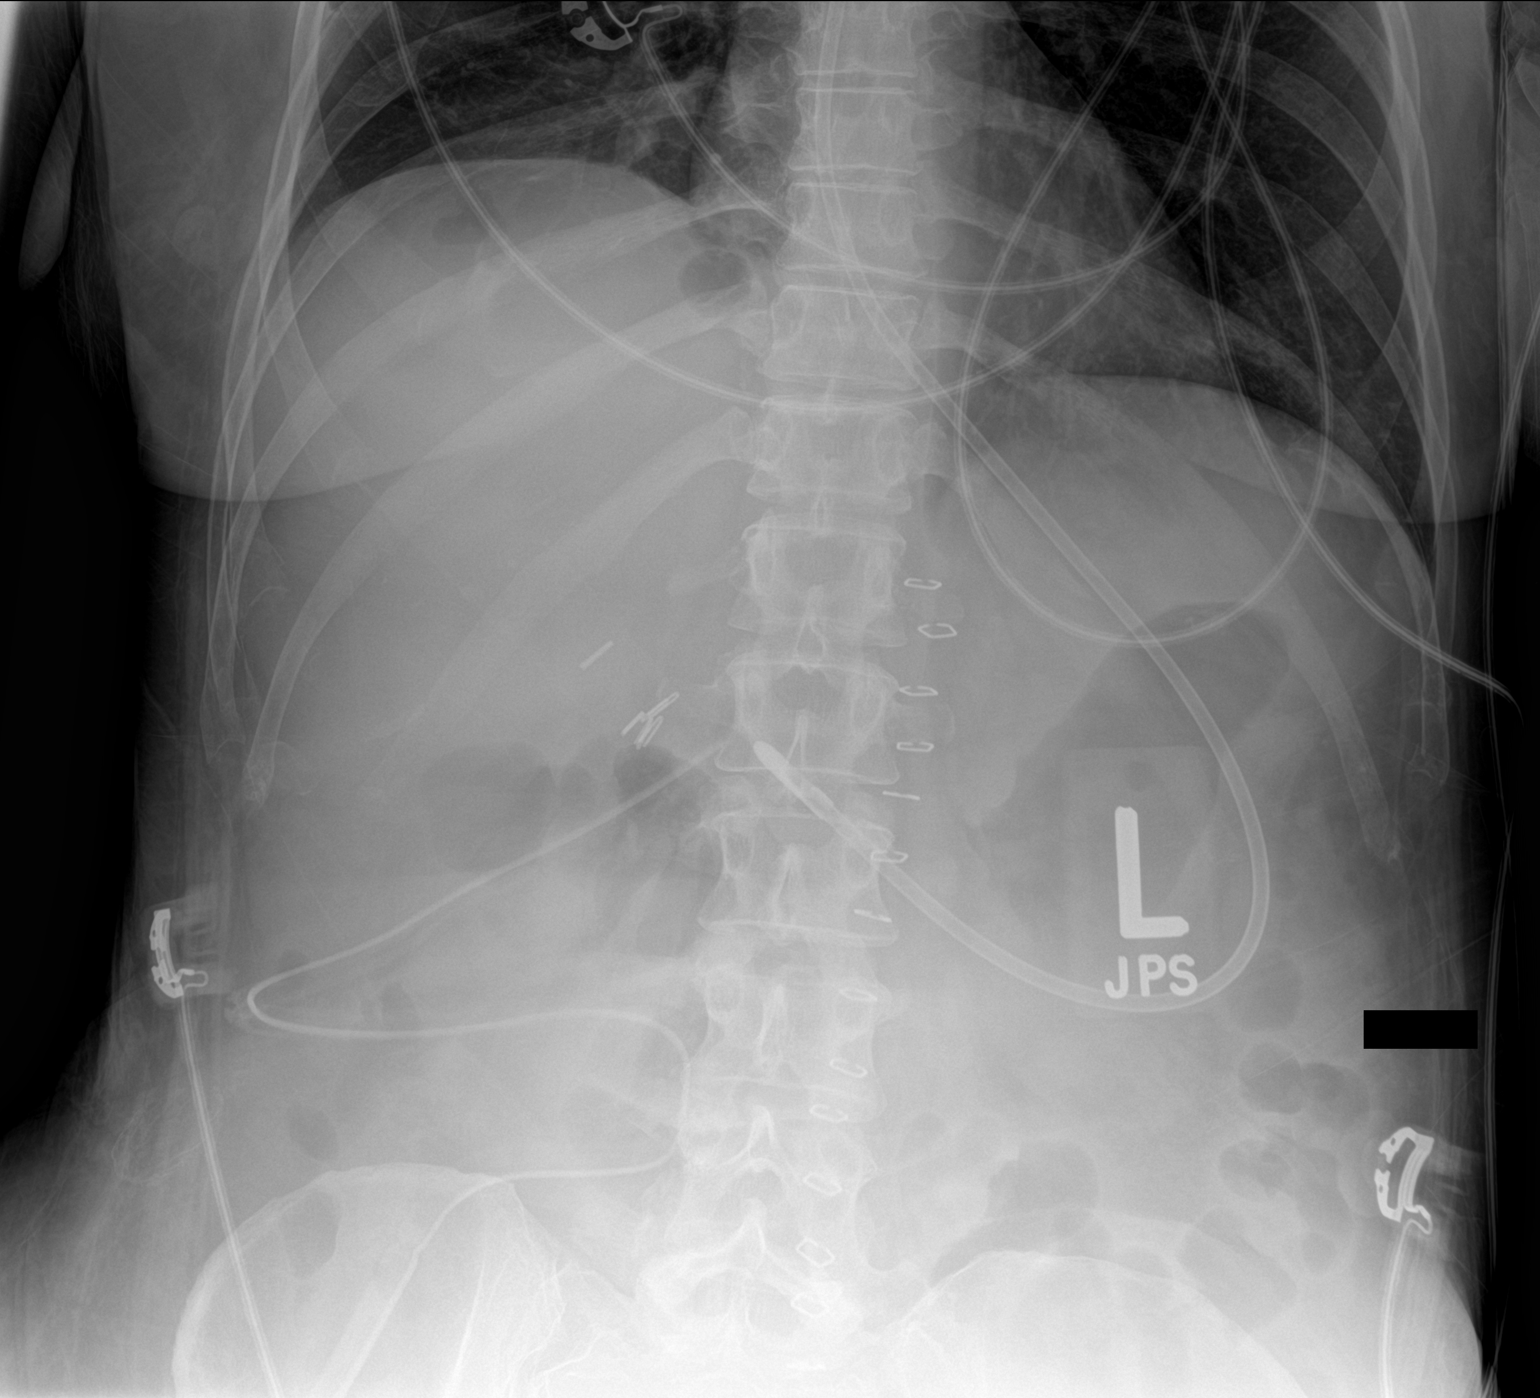

[1 of 1 positions shown; findings below may reference images not displayed]

FINDINGS: Tip of feeding tube projects over distal gastric antrum.

Drain identified in RIGHT mid abdomen.

Surgical clips RIGHT upper quadrant from cholecystectomy.

Nonobstructive bowel gas pattern.

Mild atelectasis at RIGHT lung base.

Two rounded foci of gas are identified high in the RIGHT RIGHT upper
quadrant, potentially related to indwelling surgical drain in the
RIGHT mid abdomen.

No acute osseous findings.
IMPRESSION: Tip of feeding tube projects over distal gastric antrum.

Two foci of gas project over the superior aspect of the RIGHT upper
quadrant/liver, may be related to indwelling surgical drain in the
RIGHT mid abdomen.
